# Patient Record
Sex: Female | Born: 1955 | Race: Black or African American | Hispanic: No | Marital: Married | Smoking: Former smoker
Health system: Southern US, Community
[De-identification: ages and names within clinical notes are randomized; demographics above are authoritative.]

## PROBLEM LIST (undated history)

## (undated) DIAGNOSIS — M1990 Unspecified osteoarthritis, unspecified site: Secondary | ICD-10-CM

## (undated) DIAGNOSIS — T7840XA Allergy, unspecified, initial encounter: Secondary | ICD-10-CM

## (undated) DIAGNOSIS — I10 Essential (primary) hypertension: Secondary | ICD-10-CM

## (undated) DIAGNOSIS — F329 Major depressive disorder, single episode, unspecified: Secondary | ICD-10-CM

## (undated) DIAGNOSIS — J45909 Unspecified asthma, uncomplicated: Secondary | ICD-10-CM

## (undated) DIAGNOSIS — E785 Hyperlipidemia, unspecified: Secondary | ICD-10-CM

## (undated) DIAGNOSIS — K319 Disease of stomach and duodenum, unspecified: Secondary | ICD-10-CM

## (undated) DIAGNOSIS — J189 Pneumonia, unspecified organism: Secondary | ICD-10-CM

## (undated) DIAGNOSIS — D649 Anemia, unspecified: Secondary | ICD-10-CM

## (undated) DIAGNOSIS — K219 Gastro-esophageal reflux disease without esophagitis: Secondary | ICD-10-CM

## (undated) DIAGNOSIS — F32A Depression, unspecified: Secondary | ICD-10-CM

## (undated) DIAGNOSIS — E119 Type 2 diabetes mellitus without complications: Secondary | ICD-10-CM

## (undated) HISTORY — DX: Gastro-esophageal reflux disease without esophagitis: K21.9

## (undated) HISTORY — DX: Allergy, unspecified, initial encounter: T78.40XA

## (undated) HISTORY — PX: EYE SURGERY: SHX253

## (undated) HISTORY — PX: TUBAL LIGATION: SHX77

## (undated) HISTORY — DX: Disease of stomach and duodenum, unspecified: K31.9

## (undated) HISTORY — DX: Hyperlipidemia, unspecified: E78.5

## (undated) HISTORY — DX: Essential (primary) hypertension: I10

## (undated) HISTORY — DX: Depression, unspecified: F32.A

## (undated) HISTORY — DX: Major depressive disorder, single episode, unspecified: F32.9

## (undated) HISTORY — PX: DILATION AND CURETTAGE OF UTERUS: SHX78

## (undated) HISTORY — PX: NASAL SINUS SURGERY: SHX719

## (undated) HISTORY — PX: TONSILLECTOMY: SUR1361

## (undated) HISTORY — PX: LAPAROSCOPIC OVARIAN CYSTECTOMY: SUR786

## (undated) HISTORY — DX: Type 2 diabetes mellitus without complications: E11.9

---

## 2005-02-06 ENCOUNTER — Emergency Department: Payer: Self-pay | Admitting: Emergency Medicine

## 2005-02-06 ENCOUNTER — Other Ambulatory Visit: Payer: Self-pay

## 2005-05-14 ENCOUNTER — Emergency Department: Payer: Self-pay | Admitting: Emergency Medicine

## 2005-06-13 ENCOUNTER — Other Ambulatory Visit: Payer: Self-pay

## 2005-06-13 ENCOUNTER — Emergency Department: Payer: Self-pay | Admitting: Emergency Medicine

## 2008-04-18 ENCOUNTER — Ambulatory Visit: Payer: Self-pay | Admitting: Family Medicine

## 2009-04-26 ENCOUNTER — Ambulatory Visit: Payer: Self-pay | Admitting: Family Medicine

## 2010-02-19 ENCOUNTER — Ambulatory Visit: Payer: Self-pay | Admitting: Internal Medicine

## 2010-03-05 ENCOUNTER — Ambulatory Visit: Payer: Self-pay | Admitting: Internal Medicine

## 2010-03-21 ENCOUNTER — Ambulatory Visit: Payer: Self-pay | Admitting: Internal Medicine

## 2010-04-21 ENCOUNTER — Ambulatory Visit: Payer: Self-pay | Admitting: Internal Medicine

## 2010-04-28 ENCOUNTER — Ambulatory Visit: Payer: Self-pay | Admitting: Internal Medicine

## 2010-05-30 ENCOUNTER — Ambulatory Visit: Payer: Self-pay | Admitting: Internal Medicine

## 2010-06-21 ENCOUNTER — Ambulatory Visit: Payer: Self-pay | Admitting: Internal Medicine

## 2010-07-22 ENCOUNTER — Ambulatory Visit: Payer: Self-pay | Admitting: Internal Medicine

## 2010-10-10 ENCOUNTER — Ambulatory Visit: Payer: Self-pay | Admitting: Internal Medicine

## 2010-10-22 ENCOUNTER — Ambulatory Visit: Payer: Self-pay | Admitting: Internal Medicine

## 2011-01-09 ENCOUNTER — Ambulatory Visit: Payer: Self-pay | Admitting: Internal Medicine

## 2011-01-20 ENCOUNTER — Ambulatory Visit: Payer: Self-pay | Admitting: Internal Medicine

## 2011-03-31 DIAGNOSIS — J3501 Chronic tonsillitis: Secondary | ICD-10-CM | POA: Insufficient documentation

## 2011-05-12 ENCOUNTER — Ambulatory Visit: Payer: Self-pay | Admitting: Internal Medicine

## 2011-05-14 ENCOUNTER — Ambulatory Visit: Payer: Self-pay | Admitting: Internal Medicine

## 2011-05-21 ENCOUNTER — Ambulatory Visit: Payer: Self-pay | Admitting: Unknown Physician Specialty

## 2011-05-23 ENCOUNTER — Ambulatory Visit: Payer: Self-pay | Admitting: Internal Medicine

## 2011-05-23 ENCOUNTER — Ambulatory Visit: Payer: Self-pay | Admitting: Unknown Physician Specialty

## 2011-06-22 ENCOUNTER — Ambulatory Visit: Payer: Self-pay | Admitting: Unknown Physician Specialty

## 2011-09-17 ENCOUNTER — Ambulatory Visit: Payer: Self-pay | Admitting: Internal Medicine

## 2011-09-22 ENCOUNTER — Ambulatory Visit: Payer: Self-pay | Admitting: Internal Medicine

## 2012-02-08 ENCOUNTER — Ambulatory Visit: Payer: Self-pay | Admitting: Internal Medicine

## 2012-02-08 LAB — CBC CANCER CENTER
Basophil %: 0.6 %
Eosinophil #: 0.6 x10 3/mm (ref 0.0–0.7)
HCT: 34.8 % — ABNORMAL LOW (ref 35.0–47.0)
HGB: 11.2 g/dL — ABNORMAL LOW (ref 12.0–16.0)
Lymphocyte #: 3.8 x10 3/mm — ABNORMAL HIGH (ref 1.0–3.6)
MCH: 27.5 pg (ref 26.0–34.0)
Monocyte #: 0.8 x10 3/mm (ref 0.2–0.9)
Monocyte %: 7.3 %
Platelet: 316 x10 3/mm (ref 150–440)
RBC: 4.06 10*6/uL (ref 3.80–5.20)

## 2012-02-08 LAB — IRON AND TIBC
Iron Saturation: 9 %
Iron: 39 ug/dL — ABNORMAL LOW (ref 50–170)
Unbound Iron-Bind.Cap.: 403 ug/dL

## 2012-02-08 LAB — FERRITIN: Ferritin (ARMC): 18 ng/mL (ref 8–388)

## 2012-02-11 LAB — OCCULT BLOOD X 1 CARD TO LAB, STOOL
Occult Blood, Feces: NEGATIVE
Occult Blood, Feces: NEGATIVE

## 2012-02-20 ENCOUNTER — Ambulatory Visit: Payer: Self-pay | Admitting: Internal Medicine

## 2012-04-15 ENCOUNTER — Ambulatory Visit: Payer: Self-pay | Admitting: Gastroenterology

## 2012-04-19 LAB — PATHOLOGY REPORT

## 2012-07-06 ENCOUNTER — Ambulatory Visit: Payer: Self-pay | Admitting: Family Medicine

## 2012-08-08 ENCOUNTER — Ambulatory Visit: Payer: Self-pay | Admitting: Internal Medicine

## 2012-08-08 LAB — CBC CANCER CENTER
Basophil #: 0.1 x10 3/mm (ref 0.0–0.1)
Basophil %: 0.8 %
Eosinophil #: 0.5 x10 3/mm (ref 0.0–0.7)
HCT: 34.9 % — ABNORMAL LOW (ref 35.0–47.0)
HGB: 10.9 g/dL — ABNORMAL LOW (ref 12.0–16.0)
Lymphocyte #: 3.6 x10 3/mm (ref 1.0–3.6)
Lymphocyte %: 36.7 %
MCHC: 31.2 g/dL — ABNORMAL LOW (ref 32.0–36.0)
Neutrophil #: 5.2 x10 3/mm (ref 1.4–6.5)
RBC: 4.11 10*6/uL (ref 3.80–5.20)
RDW: 13.7 % (ref 11.5–14.5)
WBC: 9.9 x10 3/mm (ref 3.6–11.0)

## 2012-08-21 ENCOUNTER — Ambulatory Visit: Payer: Self-pay | Admitting: Internal Medicine

## 2012-11-18 DIAGNOSIS — J329 Chronic sinusitis, unspecified: Secondary | ICD-10-CM | POA: Insufficient documentation

## 2012-12-07 DIAGNOSIS — J320 Chronic maxillary sinusitis: Secondary | ICD-10-CM | POA: Insufficient documentation

## 2013-02-07 ENCOUNTER — Ambulatory Visit: Payer: Self-pay | Admitting: Internal Medicine

## 2013-07-02 DIAGNOSIS — E782 Mixed hyperlipidemia: Secondary | ICD-10-CM | POA: Insufficient documentation

## 2013-07-07 ENCOUNTER — Ambulatory Visit: Payer: Self-pay | Admitting: Family Medicine

## 2013-09-10 ENCOUNTER — Emergency Department: Payer: Self-pay | Admitting: Emergency Medicine

## 2013-09-10 LAB — BASIC METABOLIC PANEL
Anion Gap: 6 — ABNORMAL LOW (ref 7–16)
BUN: 11 mg/dL (ref 7–18)
Chloride: 110 mmol/L — ABNORMAL HIGH (ref 98–107)
Co2: 25 mmol/L (ref 21–32)
Creatinine: 0.75 mg/dL (ref 0.60–1.30)
EGFR (African American): >60
Potassium: 3.8 mmol/L (ref 3.5–5.1)
Sodium: 141 mmol/L (ref 136–145)

## 2013-09-10 LAB — CBC
HCT: 32 % — ABNORMAL LOW (ref 35.0–47.0)
MCH: 25.6 pg — ABNORMAL LOW (ref 26.0–34.0)
MCV: 82 fL (ref 80–100)
RBC: 3.9 10*6/uL (ref 3.80–5.20)

## 2014-08-07 DIAGNOSIS — M542 Cervicalgia: Secondary | ICD-10-CM | POA: Insufficient documentation

## 2014-08-21 ENCOUNTER — Ambulatory Visit: Payer: Self-pay | Admitting: Internal Medicine

## 2014-10-31 ENCOUNTER — Ambulatory Visit (INDEPENDENT_AMBULATORY_CARE_PROVIDER_SITE_OTHER): Admitting: Cardiovascular Disease

## 2014-10-31 ENCOUNTER — Encounter: Payer: Self-pay | Admitting: Cardiovascular Disease

## 2014-10-31 ENCOUNTER — Encounter (INDEPENDENT_AMBULATORY_CARE_PROVIDER_SITE_OTHER): Payer: Self-pay

## 2014-10-31 VITALS — BP 120/72 | HR 84 | Ht 65.0 in | Wt 171.5 lb

## 2014-10-31 DIAGNOSIS — I10 Essential (primary) hypertension: Secondary | ICD-10-CM

## 2014-10-31 DIAGNOSIS — E669 Obesity, unspecified: Secondary | ICD-10-CM

## 2014-10-31 DIAGNOSIS — E785 Hyperlipidemia, unspecified: Secondary | ICD-10-CM | POA: Insufficient documentation

## 2014-10-31 DIAGNOSIS — R079 Chest pain, unspecified: Secondary | ICD-10-CM

## 2014-10-31 DIAGNOSIS — R9431 Abnormal electrocardiogram [ECG] [EKG]: Secondary | ICD-10-CM

## 2014-10-31 DIAGNOSIS — E663 Overweight: Secondary | ICD-10-CM | POA: Insufficient documentation

## 2014-10-31 MED ORDER — CYCLOBENZAPRINE HCL 10 MG PO TABS: 10.0000 mg | ORAL_TABLET | Freq: Three times a day (TID) | ORAL | Status: DC | PRN

## 2014-10-31 NOTE — Progress Notes (Signed)
Patient ID: Megan Mooney, female    DOB: 03/02/1956, 59 y.o.   MRN: [id number]  HPI Comments: Megan Mooney is a 59 year old woman with history of type 2 diabetes, hemoglobin A1c 7.2, mild obesity, hypertension, hyperlipidemia who presents by referral for evaluation of abnormal EKG and chest pain.  She reports that over the past several days she has had right-sided chest pain underneath her right breast. Symptoms started over the weekend. She denies any heavy lifting or any trauma. Symptoms are worse when she stands up, also bad when she bends over, also a sharp pain when she breathes in deep. Stabbing pain goes from the right side under her breast inside, sometimes to her back with a deep breath. She denies any cough or congestion. She does notice the symptoms sometimes in bed with certain positions. Denies having any worsening symptoms with exertion such as walking. Typically she is very active with no complaints. She is trying to do better with her diet to get her hemoglobin A1c less than 7  EKG on today's visit in the office shows normal sinus rhythm with rate 84 bpm, no significant ST or T-wave changes EKG through primary care is essentially the same as last a day. There was a question of nonspecific T wave abnormality. This was not really seen on any of the EKGs from primary care every 10th and again not seen today   Allergies  Allergen Reactions  . Advil [Ibuprofen]     Shortness of breath.    Outpatient Encounter Prescriptions as of 10/31/2014  Medication Sig  . aspirin 81 MG tablet Take 81 mg by mouth daily.  . canagliflozin (INVOKANA) 100 MG TABS tablet Take 100 mg by mouth daily.  . cetirizine (ZYRTEC) 10 MG tablet Take 10 mg by mouth daily.  . cyclobenzaprine (FLEXERIL) 10 MG tablet Take 1 tablet (10 mg total) by mouth 3 (three) times daily as needed for muscle spasms.  . hydrocortisone cream 1 % Apply 1 application topically as needed.   . insulin aspart (NOVOLOG) 100 UNIT/ML  FlexPen Inject 15 Units into the skin daily.  . Insulin Detemir (LEVEMIR) 100 UNIT/ML Pen Inject 80 Units into the skin daily.  Marland Kitchen. lisinopril (PRINIVIL,ZESTRIL) 20 MG tablet Take 20 mg by mouth daily.  . metFORMIN (GLUMETZA) 1000 MG (MOD) 24 hr tablet Take one tablet in the am with half tablet in the pm.  . naproxen sodium (ANAPROX) 220 MG tablet Take 220 mg by mouth 2 (two) times daily with a meal.  . nitroGLYCERIN (NITROSTAT) 0.4 MG SL tablet Place 0.4 mg under the tongue every 5 (five) minutes as needed for chest pain.  Marland Kitchen. omeprazole (PRILOSEC) 20 MG capsule Take 20 mg by mouth daily.  . simvastatin (ZOCOR) 20 MG tablet Take 20 mg by mouth daily.    Past Medical History  Diagnosis Date  . Diabetes mellitus without complication   . Hypertension   . Hyperlipidemia   . Depression     Past Surgical History  Procedure Laterality Date  . Tubal ligation    . Tonsillectomy    . Laparoscopic ovarian cystectomy    . Nasal sinus surgery      Social History  reports that she quit smoking about 28 years ago. Her smoking use included Cigarettes. She has a 2.5 pack-year smoking history. She does not have any smokeless tobacco history on file. She reports that she does not drink alcohol or use illicit drugs.  Family History family history includes Hyperlipidemia in her  brother, father, and mother; Hypertension in her brother, father, and mother.  Review of Systems  Constitutional: Negative.   Respiratory: Positive for chest tightness.   Cardiovascular: Negative.   Gastrointestinal: Negative.   Musculoskeletal: Negative.   Skin: Negative.   Neurological: Negative.   Hematological: Negative.   Psychiatric/Behavioral: Negative.   All other systems reviewed and are negative.   BP 120/72 mmHg  Pulse 84  Ht  (1.651 m)  Wt 171 lb 8 oz (77.792 kg)  BMI 28.54 kg/m2   Physical Exam  Constitutional: She is oriented to person, place, and time. She appears well-developed and  well-nourished.  HENT:  Head: Normocephalic.  Nose: Nose normal.  Mouth/Throat: Oropharynx is clear and moist.  Eyes: Conjunctivae are normal. Pupils are equal, round, and reactive to light.  Neck: Normal range of motion. Neck supple. No JVD present.  Cardiovascular: Normal rate, regular rhythm, S1 normal, S2 normal, normal heart sounds and intact distal pulses.  Exam reveals no gallop and no friction rub.   No murmur heard. Pulmonary/Chest: Effort normal and breath sounds normal. No respiratory distress. She has no wheezes. She has no rales. She exhibits no tenderness.  Abdominal: Soft. Bowel sounds are normal. She exhibits no distension. There is no tenderness.  Musculoskeletal: Normal range of motion. She exhibits no edema or tenderness.  Lymphadenopathy:    She has no cervical adenopathy.  Neurological: She is alert and oriented to person, place, and time. Coordination normal.  Skin: Skin is warm and dry. No rash noted. No erythema.  Psychiatric: She has a normal mood and affect. Her behavior is normal. Judgment and thought content normal.    Assessment and Plan  Nursing note and vitals reviewed.

## 2014-10-31 NOTE — Assessment & Plan Note (Signed)
Essentially normal EKG on today's visit. No further testing needed

## 2014-10-31 NOTE — Assessment & Plan Note (Signed)
We have encouraged continued exercise, careful diet management in an effort to lose weight. 

## 2014-10-31 NOTE — Assessment & Plan Note (Signed)
Encourage her to stay on her simvastatin. Goal LDL less than 100 as she is a diabetic

## 2014-10-31 NOTE — Assessment & Plan Note (Signed)
Blood pressure is well controlled on today's visit. No changes made to the medications. 

## 2014-10-31 NOTE — Patient Instructions (Signed)
You are doing well.  You likely have a musculoligamental strain Please take aleve 1 to 2 pills twice a day as needed  Take with flexeril up to three times a day as needed, Cut in 1/2 to start, can cause fatigue  Please call us if you have new issues that need to be addressed before your next appt.

## 2014-10-31 NOTE — Assessment & Plan Note (Signed)
Musculoskeletal chest pain, reproducible with deep breath, bending over, standing. Suggested she try NSAIDs. Prescription for Flexeril provided.

## 2015-03-07 ENCOUNTER — Encounter: Payer: Self-pay | Admitting: Family Medicine

## 2015-03-07 ENCOUNTER — Ambulatory Visit (INDEPENDENT_AMBULATORY_CARE_PROVIDER_SITE_OTHER): Admitting: Family Medicine

## 2015-03-07 ENCOUNTER — Ambulatory Visit
Admission: RE | Admit: 2015-03-07 | Discharge: 2015-03-07 | Disposition: A | Discharge status: Home / Self Care | Source: Ambulatory Visit | Attending: Family Medicine | Admitting: Family Medicine

## 2015-03-07 VITALS — BP 138/78 | HR 71 | Temp 98.3°F | Resp 16 | Ht 64.0 in | Wt 163.8 lb

## 2015-03-07 DIAGNOSIS — R05 Cough: Secondary | ICD-10-CM | POA: Insufficient documentation

## 2015-03-07 DIAGNOSIS — Z87891 Personal history of nicotine dependence: Secondary | ICD-10-CM | POA: Diagnosis not present

## 2015-03-07 DIAGNOSIS — R0602 Shortness of breath: Secondary | ICD-10-CM | POA: Diagnosis not present

## 2015-03-07 DIAGNOSIS — D509 Iron deficiency anemia, unspecified: Secondary | ICD-10-CM | POA: Insufficient documentation

## 2015-03-07 DIAGNOSIS — R052 Subacute cough: Secondary | ICD-10-CM

## 2015-03-07 DIAGNOSIS — I10 Essential (primary) hypertension: Secondary | ICD-10-CM

## 2015-03-07 DIAGNOSIS — J4520 Mild intermittent asthma, uncomplicated: Secondary | ICD-10-CM | POA: Insufficient documentation

## 2015-03-07 DIAGNOSIS — K219 Gastro-esophageal reflux disease without esophagitis: Secondary | ICD-10-CM | POA: Insufficient documentation

## 2015-03-07 DIAGNOSIS — J309 Allergic rhinitis, unspecified: Secondary | ICD-10-CM | POA: Insufficient documentation

## 2015-03-07 MED ORDER — AZITHROMYCIN 250 MG PO TABS: ORAL_TABLET | ORAL | Status: AC

## 2015-03-07 MED ORDER — BENZONATATE 100 MG PO CAPS: 100.0000 mg | ORAL_CAPSULE | Freq: Three times a day (TID) | ORAL | Status: DC | PRN

## 2015-03-07 MED ORDER — PROAIR HFA 108 (90 BASE) MCG/ACT IN AERS: INHALATION_SPRAY | RESPIRATORY_TRACT | Status: DC

## 2015-03-07 MED ORDER — LOSARTAN POTASSIUM 100 MG PO TABS: 100.0000 mg | ORAL_TABLET | Freq: Every day | ORAL | Status: DC

## 2015-03-07 NOTE — Patient Instructions (Signed)
Cough: Tessalon perles to help with cough. Antibiotic for 5 days.  I have renewed your inhaler and changed your BP medication.  Ideally these will help with cough.  We will get breathing tests if cough doesn't get better.   Please seek immediate medical attention if you develop shortness of breath not relieve by inhaler, chest pain/tightness, fever > 103 F or other concerning symptoms.

## 2015-03-07 NOTE — Assessment & Plan Note (Signed)
Chest XR clear today. Treat for bronchitis given lingering symptoms after viral infection. Offered pt prednisone, pt would like to try as last resort. She will  Call in 2 weeks if symptoms do not improve. Change ACE to ARB for cough.  Renewed inhaler.  Spirometry will be done to assess asthma control.

## 2015-03-07 NOTE — Assessment & Plan Note (Signed)
BP has not been well controlled at home. Change ACE to ARB for cough. Increase dose. Pt will continue to check BP at home. Dash diet reviewed. RTC 1 month.

## 2015-03-07 NOTE — Progress Notes (Signed)
Subjective:    Patient ID: Megan Mooney, female    DOB: 1956/05/01, 59 y.o.   MRN: [id number]  HPI: Megan Mooney is a 59 y.o. female presenting on 03/07/2015 for Cough and Hypertension   Cough This is a recurrent problem. The current episode started more than 1 month ago. The problem has been unchanged. The problem occurs hourly. The cough is productive of purulent sputum. Associated symptoms include rhinorrhea and shortness of breath (with coughing.). Pertinent negatives include no chest pain, chills, fever, headaches, myalgias, nasal congestion, rash, sore throat or wheezing. The symptoms are aggravated by lying down and exercise. She has tried a beta-agonist inhaler and OTC cough suppressant for the symptoms. The treatment provided mild relief. Her past medical history is significant for asthma (pt wasn't sure if it was truly asthma. Was placed on inhaler due to cough).  Cough present since the beginning of May. Was thought to be PND- did not improve with flonase, inhaler, and OTC cough suppressant. Pt went to [place] and returned on 5/29 and was diagnosed with the flu. Cough symptoms remain- clear sputum production, shortness of breath with exertion. Denies chest tightness but describes it as "feeling gurgling."  Pt is taking an ACE inhibitor- about 1 year.    HTN: BP is elevated in the office. Pt has also been checking at home and continues to be elevated. 093/23-55. No CP, visual changes, or HA.   Past Medical History  Diagnosis Date  . Diabetes mellitus without complication   . Hypertension   . Hyperlipidemia   . Depression     Current Outpatient Prescriptions on File Prior to Visit  Medication Sig  . aspirin 81 MG tablet Take 81 mg by mouth daily.  . cetirizine (ZYRTEC) 10 MG tablet Take 10 mg by mouth daily.  . hydrocortisone cream 1 % Apply 1 application topically as needed.   . insulin aspart (NOVOLOG) 100 UNIT/ML FlexPen Inject 15 Units into the skin daily.  . Insulin Detemir  (LEVEMIR) 100 UNIT/ML Pen Inject 80 Units into the skin daily.  . metFORMIN (GLUMETZA) 1000 MG (MOD) 24 hr tablet Take one tablet in the am with half tablet in the pm.  . naproxen sodium (ANAPROX) 220 MG tablet Take 220 mg by mouth 2 (two) times daily with a meal.  . omeprazole (PRILOSEC) 20 MG capsule Take 20 mg by mouth daily.  . simvastatin (ZOCOR) 20 MG tablet Take 20 mg by mouth daily.  . canagliflozin (INVOKANA) 100 MG TABS tablet Take 100 mg by mouth daily.  . cyclobenzaprine (FLEXERIL) 10 MG tablet Take 1 tablet (10 mg total) by mouth 3 (three) times daily as needed for muscle spasms. (Patient not taking: Reported on 03/07/2015)  . nitroGLYCERIN (NITROSTAT) 0.4 MG SL tablet Place 0.4 mg under the tongue every 5 (five) minutes as needed for chest pain.   No current facility-administered medications on file prior to visit.    Review of Systems  Constitutional: Negative for fever and chills.  HENT: Positive for rhinorrhea. Negative for sinus pressure and sore throat.   Respiratory: Positive for cough, chest tightness and shortness of breath (with coughing.). Negative for wheezing.   Cardiovascular: Negative for chest pain.  Genitourinary: Negative.   Musculoskeletal: Negative for myalgias, back pain, arthralgias, neck pain and neck stiffness.  Skin: Negative for rash and wound.  Neurological: Negative for light-headedness, numbness and headaches.  Psychiatric/Behavioral: Negative.    Per HPI unless specifically indicated above     Objective:  BP 138/78 mmHg  Pulse 71  Temp(Src) 98.3 F (36.8 C) (Oral)  Resp 16  Ht $R'5\' 4"'Gp$  (1.626 m)  Wt 163 lb 12.8 oz (74.299 kg)  BMI 28.10 kg/m2  SpO2 99%  LMP   Wt Readings from Last 3 Encounters:  03/07/15 163 lb 12.8 oz (74.299 kg)  10/31/14 171 lb 8 oz (77.792 kg)    Physical Exam  Constitutional: No distress.  HENT:  Right Ear: Hearing and tympanic membrane normal.  Left Ear: Hearing and tympanic membrane normal.  Nose: Mucosal  edema present. No rhinorrhea.  Mouth/Throat: Oropharynx is clear and moist and mucous membranes are normal. No oropharyngeal exudate, posterior oropharyngeal edema or posterior oropharyngeal erythema.  Neck: Normal range of motion. Neck supple.  Cardiovascular: Normal rate, regular rhythm, S1 normal and S2 normal.  Exam reveals no gallop and no friction rub.   No murmur heard. Pulmonary/Chest: No accessory muscle usage. No respiratory distress. She has decreased breath sounds in the right lower field and the left lower field. She has wheezes (with coughing.). She has no rhonchi. She has no rales. Chest wall is not dull to percussion. She exhibits no tenderness.  Musculoskeletal: Normal range of motion. She exhibits no edema or tenderness.  Lymphadenopathy:    She has no cervical adenopathy.  Skin: Skin is warm. No rash noted. She is not diaphoretic. No erythema.   Results for orders placed or performed in visit on 17/49/44  Basic metabolic panel  Result Value Ref Range   Glucose 86 65-99 mg/dL   BUN 11 7-18 mg/dL   Creatinine 0.75 0.60-1.30 mg/dL   Sodium 141 136-145 mmol/L   Potassium 3.8 3.5-5.1 mmol/L   Chloride 110 (H) 98-107 mmol/L   Co2 25 21-32 mmol/L   Calcium, Total 9.2 8.5-10.1 mg/dL   Osmolality 280 275-301   Anion Gap 6 (L) 7-16   EGFR (African American) >60    EGFR (Non-African Amer.) >60   CBC  Result Value Ref Range   WBC 11.6 (H) 3.6-11.0 x10 3/mm 3   RBC 3.90 3.80-5.20 X10 6/mm 3   HGB 10.0 (L) 12.0-16.0 g/dL   HCT 32.0 (L) 35.0-47.0 %   MCV 82 80-100 fL   MCH 25.6 (L) 26.0-34.0 pg   MCHC 31.2 (L) 32.0-36.0 g/dL   RDW 14.0 11.5-14.5 %   Platelet 291 150-440 x10 3/mm 3      Assessment & Plan:   Problem List Items Addressed This Visit      Cardiovascular and Mediastinum   Essential hypertension    BP has not been well controlled at home. Change ACE to ARB for cough. Increase dose. Pt will continue to check BP at home. Dash diet reviewed. RTC 1 month.        Relevant Medications   losartan (COZAAR) 100 MG tablet     Other   Subacute cough - Primary    Chest XR clear today. Treat for bronchitis given lingering symptoms after viral infection. Offered pt prednisone, pt would like to try as last resort. She will  Call in 2 weeks if symptoms do not improve. Change ACE to ARB for cough.  Renewed inhaler.  Spirometry will be done to assess asthma control.       Relevant Medications   PROAIR HFA 108 (90 BASE) MCG/ACT inhaler   benzonatate (TESSALON) 100 MG capsule   azithromycin (ZITHROMAX) 250 MG tablet   Other Relevant Orders   DG Chest 2 View (Completed)      Meds ordered  this encounter  Medications  . ACCU-CHEK FASTCLIX LANCETS MISC    Sig:   . empagliflozin (JARDIANCE) 25 MG TABS tablet    Sig: Take 25 mg by mouth.  . fluticasone (FLONASE) 50 MCG/ACT nasal spray    Sig: Place 1 spray into both nostrils every morning.  . Multiple Vitamin (MULTIVITAMIN) capsule    Sig: Take by mouth.  . DISCONTD: PROAIR HFA 108 (90 BASE) MCG/ACT inhaler    Sig: INHALE 2 PUFFS 4 TIMES DAILY AS NEEDED    Refill:  0  . Dextromethorphan-Guaifenesin (MUCINEX DM) 30-600 MG TB12    Sig: Take 1 tablet by mouth 2 (two) times daily.    Refill:  0  . losartan (COZAAR) 100 MG tablet    Sig: Take 1 tablet (100 mg total) by mouth daily.    Dispense:  90 tablet    Refill:  3    Order Specific Question:  Supervising Provider    Answer:  Arlis Porta [phone]  . PROAIR HFA 108 (90 BASE) MCG/ACT inhaler    Sig: INHALE 2 PUFFS 4 TIMES DAILY AS NEEDED    Dispense:  1 Inhaler    Refill:  11    Order Specific Question:  Supervising Provider    Answer:  Arlis Porta [phone]  . benzonatate (TESSALON) 100 MG capsule    Sig: Take 1 capsule (100 mg total) by mouth 3 (three) times daily as needed for cough.    Dispense:  30 capsule    Refill:  1    Order Specific Question:  Supervising Provider    Answer:  Arlis Porta [phone]  .  azithromycin (ZITHROMAX) 250 MG tablet    Sig: Dispense as Zpak- Take 2 pills today. Then day 1 pill daily for days 2-5    Dispense:  6 tablet    Refill:  0    Order Specific Question:  Supervising Provider    Answer:  Arlis Porta [[id number]]      Follow up plan: Return in about 4 weeks (around 04/04/2015).

## 2015-03-29 ENCOUNTER — Other Ambulatory Visit: Payer: Self-pay | Admitting: Family Medicine

## 2015-04-09 ENCOUNTER — Encounter: Payer: Self-pay | Admitting: Family Medicine

## 2015-04-09 ENCOUNTER — Ambulatory Visit (INDEPENDENT_AMBULATORY_CARE_PROVIDER_SITE_OTHER): Admitting: Family Medicine

## 2015-04-09 VITALS — BP 146/91 | HR 73 | Temp 98.1°F | Resp 16 | Ht 64.0 in | Wt 164.2 lb

## 2015-04-09 DIAGNOSIS — I10 Essential (primary) hypertension: Secondary | ICD-10-CM

## 2015-04-09 DIAGNOSIS — K219 Gastro-esophageal reflux disease without esophagitis: Secondary | ICD-10-CM | POA: Diagnosis not present

## 2015-04-09 MED ORDER — OMEPRAZOLE 20 MG PO CPDR: 20.0000 mg | DELAYED_RELEASE_CAPSULE | Freq: Every day | ORAL | Status: DC

## 2015-04-09 MED ORDER — LOSARTAN POTASSIUM-HCTZ 100-12.5 MG PO TABS: 1.0000 | ORAL_TABLET | Freq: Every day | ORAL | Status: DC

## 2015-04-09 NOTE — Patient Instructions (Addendum)
Your goal blood pressure is 140/90. Work on low salt/sodium diet - goal <2.4gm (2,400mg) per day. Eat a diet high in fruits/vegetables and whole grains.  Look into mediterranean and DASH diet. Goal activity is 150min/wk of moderate intensity exercise.  This can be split into 30 minute chunks.  If you are not at this level, you can start with smaller 10-15 min increments and slowly build up activity. Look at www.heart.org for more resources  Please seek immediate medical attention at ER or Urgent Care if you develop: Chest pain, pressure or tightness. Shortness of breath accompanied by nausea or diaphoresis Visual changes Numbness or tingling on one side of the body Facial droop Altered mental status Or any concerning symptoms.  

## 2015-04-09 NOTE — Assessment & Plan Note (Signed)
Continue omeprazole. Alarm symptoms reviewed.

## 2015-04-09 NOTE — Progress Notes (Signed)
Subjective:    Patient ID: Megan Mooney, female    DOB: May 15, 1956, 59 y.o.   MRN: [id number]  HPI: Megan Mooney is a 59 y.o. female presenting on 04/09/2015 for Hypertension and Gastrophageal Reflux   Hypertension This is a chronic problem. The current episode started more than 1 year ago. The problem has been gradually worsening since onset. Associated symptoms include headaches. Pertinent negatives include no anxiety, blurred vision, chest pain, neck pain, palpitations, peripheral edema, shortness of breath or sweats. Risk factors for coronary artery disease include diabetes mellitus and dyslipidemia.  Gastrophageal Reflux She complains of heartburn (occasional). She reports no abdominal pain, no chest pain, no choking, no dysphagia, no early satiety, no sore throat or no wheezing. This is a chronic problem. The current episode started more than 1 year ago. The problem has been unchanged. The heartburn is of mild intensity. The heartburn does not wake her from sleep. The heartburn does not limit her activity. The heartburn doesn't change with position. The symptoms are aggravated by certain foods. Pertinent negatives include no fatigue, melena, muscle weakness or weight loss. She has tried a PPI for the symptoms. The treatment provided significant relief.    Pt presents to follow-up blood pressure today. Her BP was low in January of this year and her diuretic was removed. Pt has since noticed her BP is creeping up. Avg 140's/90's at home.  Highest 150/97 at home. She was innovokana for diabetes but was switched off recently and her pressures have increased since then.  Reporting occasional HA. She is due for eye exam this fall at the [place].   Past Medical History  Diagnosis Date  . Diabetes mellitus without complication   . Hypertension   . Hyperlipidemia   . Depression     Current Outpatient Prescriptions on File Prior to Visit  Medication Sig  . ACCU-CHEK FASTCLIX LANCETS MISC   . aspirin  81 MG tablet Take 81 mg by mouth daily.  . benzonatate (TESSALON) 100 MG capsule Take 1 capsule (100 mg total) by mouth 3 (three) times daily as needed for cough.  . canagliflozin (INVOKANA) 100 MG TABS tablet Take 100 mg by mouth daily.  . cetirizine (ZYRTEC) 10 MG tablet TAKE 1 TABLET DAILY  . cyclobenzaprine (FLEXERIL) 10 MG tablet Take 1 tablet (10 mg total) by mouth 3 (three) times daily as needed for muscle spasms.  . Dextromethorphan-Guaifenesin (MUCINEX DM) 30-600 MG TB12 Take 1 tablet by mouth 2 (two) times daily.  . empagliflozin (JARDIANCE) 25 MG TABS tablet Take 25 mg by mouth.  . fluticasone (FLONASE) 50 MCG/ACT nasal spray Place 1 spray into both nostrils every morning.  . hydrocortisone cream 1 % Apply 1 application topically as needed.   . insulin aspart (NOVOLOG) 100 UNIT/ML FlexPen Inject 15 Units into the skin daily.  . Insulin Detemir (LEVEMIR) 100 UNIT/ML Pen Inject 80 Units into the skin daily.  Marland Kitchen losartan (COZAAR) 100 MG tablet Take 1 tablet (100 mg total) by mouth daily.  . metFORMIN (GLUMETZA) 1000 MG (MOD) 24 hr tablet Take one tablet in the am with half tablet in the pm.  . Multiple Vitamin (MULTIVITAMIN) capsule Take by mouth.  . naproxen sodium (ANAPROX) 220 MG tablet Take 220 mg by mouth 2 (two) times daily with a meal.  . nitroGLYCERIN (NITROSTAT) 0.4 MG SL tablet Place 0.4 mg under the tongue every 5 (five) minutes as needed for chest pain.  Marland Kitchen PROAIR HFA 108 (90 BASE) MCG/ACT inhaler INHALE 2  PUFFS 4 TIMES DAILY AS NEEDED  . simvastatin (ZOCOR) 20 MG tablet Take 20 mg by mouth daily.   No current facility-administered medications on file prior to visit.    Review of Systems  Constitutional: Negative for fever, chills, weight loss and fatigue.  HENT: Negative for sore throat.   Eyes: Negative for blurred vision.  Respiratory: Negative for choking, shortness of breath and wheezing.   Cardiovascular: Negative for chest pain, palpitations and leg swelling.   Gastrointestinal: Positive for heartburn (occasional). Negative for dysphagia, abdominal pain, melena and abdominal distention.  Endocrine: Positive for heat intolerance (hot flashes). Negative for cold intolerance, polydipsia, polyphagia and polyuria.  Genitourinary: Negative.   Musculoskeletal: Negative.  Negative for muscle weakness and neck pain.  Neurological: Positive for headaches. Negative for dizziness and numbness.  Psychiatric/Behavioral: Negative.    Per HPI unless specifically indicated above     Objective:    BP 146/91 mmHg  Pulse 73  Temp(Src) 98.1 F (36.7 C) (Oral)  Resp 16  Ht _0  (1.626 m)  Wt 164 lb 3.2 oz (74.481 kg)  BMI 28.17 kg/m2  Wt Readings from Last 3 Encounters:  04/09/15 164 lb 3.2 oz (74.481 kg)  03/07/15 163 lb 12.8 oz (74.299 kg)  10/31/14 171 lb 8 oz (77.792 kg)    Physical Exam  Constitutional: She is oriented to person, place, and time. She appears well-developed and well-nourished. No distress.  Neck: Normal range of motion. Neck supple. No thyromegaly present.  Cardiovascular: Normal rate and regular rhythm.  Exam reveals no gallop and no friction rub.   No murmur heard. Pulmonary/Chest: Effort normal and breath sounds normal.  Abdominal: Soft. Bowel sounds are normal. There is no tenderness. There is no rebound.  Musculoskeletal: Normal range of motion. She exhibits no edema or tenderness.  Lymphadenopathy:    She has no cervical adenopathy.  Neurological: She is alert and oriented to person, place, and time.  Skin: Skin is warm and dry. She is not diaphoretic.   Results for orders placed or performed in visit on 37/62/83  Basic metabolic panel  Result Value Ref Range   Glucose 86 65-99 mg/dL   BUN 11 7-18 mg/dL   Creatinine 0.75 0.60-1.30 mg/dL   Sodium 141 136-145 mmol/L   Potassium 3.8 3.5-5.1 mmol/L   Chloride 110 (H) 98-107 mmol/L   Co2 25 21-32 mmol/L   Calcium, Total 9.2 8.5-10.1 mg/dL   Osmolality 280 275-301   Anion  Gap 6 (L) 7-16   EGFR (African American) >60    EGFR (Non-African Amer.) >60   CBC  Result Value Ref Range   WBC 11.6 (H) 3.6-11.0 x10 3/mm 3   RBC 3.90 3.80-5.20 X10 6/mm 3   HGB 10.0 (L) 12.0-16.0 g/dL   HCT 32.0 (L) 35.0-47.0 %   MCV 82 80-100 fL   MCH 25.6 (L) 26.0-34.0 pg   MCHC 31.2 (L) 32.0-36.0 g/dL   RDW 14.0 11.5-14.5 %   Platelet 291 150-440 x10 3/mm 3      Assessment & Plan:   Problem List Items Addressed This Visit      Cardiovascular and Mediastinum   Essential hypertension - Primary    Restart HCTZ and cozaar combination due to elevation in blood pressure. Likely due to discontinuation of Invokana which dropped pt's blood pressure.  DASH diet reviewed. Pt encouraged to exercise daily. RTC 3-15mo.       Relevant Medications   losartan-hydrochlorothiazide (HYZAAR) 100-12.5 MG per tablet     Digestive  GERD without esophagitis   Relevant Medications   omeprazole (PRILOSEC) 20 MG capsule      Meds ordered this encounter  Medications  . losartan-hydrochlorothiazide (HYZAAR) 100-12.5 MG per tablet    Sig: Take 1 tablet by mouth daily.    Dispense:  90 tablet    Refill:  3    Order Specific Question:  Supervising Provider    Answer:  Arlis Porta [phone]  . omeprazole (PRILOSEC) 20 MG capsule    Sig: Take 1 capsule (20 mg total) by mouth daily.    Dispense:  90 capsule    Refill:  3    Order Specific Question:  Supervising Provider    Answer:  Arlis Porta [[id number]]      Follow up plan: Return in about 4 months (around 08/10/2015).

## 2015-04-09 NOTE — Assessment & Plan Note (Signed)
Restart HCTZ and cozaar combination due to elevation in blood pressure. Likely due to discontinuation of Invokana which dropped pt's blood pressure.  DASH diet reviewed. Pt encouraged to exercise daily. RTC 3-424mos.

## 2015-05-23 LAB — HM DIABETES EYE EXAM

## 2015-06-27 ENCOUNTER — Encounter: Payer: Self-pay | Admitting: Family Medicine

## 2015-06-27 ENCOUNTER — Ambulatory Visit (INDEPENDENT_AMBULATORY_CARE_PROVIDER_SITE_OTHER): Admitting: Family Medicine

## 2015-06-27 VITALS — BP 128/83 | HR 103 | Temp 99.1°F | Resp 16 | Ht 63.0 in | Wt 162.8 lb

## 2015-06-27 DIAGNOSIS — L659 Nonscarring hair loss, unspecified: Secondary | ICD-10-CM

## 2015-06-27 DIAGNOSIS — I10 Essential (primary) hypertension: Secondary | ICD-10-CM | POA: Diagnosis not present

## 2015-06-27 DIAGNOSIS — E119 Type 2 diabetes mellitus without complications: Secondary | ICD-10-CM

## 2015-06-27 DIAGNOSIS — Z794 Long term (current) use of insulin: Secondary | ICD-10-CM

## 2015-06-27 DIAGNOSIS — K219 Gastro-esophageal reflux disease without esophagitis: Secondary | ICD-10-CM | POA: Diagnosis not present

## 2015-06-27 DIAGNOSIS — E11311 Type 2 diabetes mellitus with unspecified diabetic retinopathy with macular edema: Secondary | ICD-10-CM | POA: Insufficient documentation

## 2015-06-27 NOTE — Progress Notes (Signed)
Date:  06/27/2015   Name:  Megan Mooney   DOB:  [birth year]   MRN:  [id number]  PCP:  Filbert Berthold, NP    Chief Complaint: Alopecia   History of Present Illness:  This is a 59 y.o. female with progressive alopecia over several years, 3 sisters with similar sxs, one saw derm and received cream that helped. Hx T2DM last a1c 7.2% followed by [facility] endo. GERD well controlled on chronic PPI. BP better back on meds. Had vit D/B12/TSH checked in July. Has f/u appt with NP in November.  Review of Systems:  Review of Systems  Respiratory: Negative for shortness of breath.   Cardiovascular: Negative for chest pain and leg swelling.  Skin: Negative for rash.    Patient Active Problem List   Diagnosis Date Noted  . Controlled type 2 diabetes mellitus without complication (HCC) 06/27/2015  . Alopecia 06/27/2015  . Allergic rhinitis 03/07/2015  . GERD without esophagitis 03/07/2015  . Anemia, iron deficiency 03/07/2015  . Asthma, intermittent 03/07/2015  . Obesity 10/31/2014  . Hyperlipidemia 10/31/2014  . Essential hypertension 10/31/2014  . Cervical pain 08/07/2014  . Antritis chronic 12/07/2012  . Chronic infection of sinus 11/18/2012  . Chronic tonsillitis 03/31/2011    Prior to Admission medications   Medication Sig Start Date End Date Taking? Authorizing Provider  ACCU-CHEK FASTCLIX LANCETS MISC  08/02/14  Yes Historical Provider, MD  aspirin 81 MG tablet Take 81 mg by mouth daily.   Yes Historical Provider, MD  cetirizine (ZYRTEC) 10 MG tablet TAKE 1 TABLET DAILY 03/29/15  Yes Amy Rusty Aus, NP  empagliflozin (JARDIANCE) 25 MG TABS tablet Take 25 mg by mouth. 11/30/14 11/30/15 Yes Historical Provider, MD  fluticasone (FLONASE) 50 MCG/ACT nasal spray Place 1 spray into both nostrils every morning.   Yes Historical Provider, MD  hydrocortisone cream 1 % Apply 1 application topically as needed.  09/25/14  Yes Historical Provider, MD  insulin aspart (NOVOLOG) 100 UNIT/ML FlexPen Inject 15  Units into the skin daily.   Yes Historical Provider, MD  Insulin Detemir (LEVEMIR) 100 UNIT/ML Pen Inject 80 Units into the skin daily.   Yes Historical Provider, MD  losartan-hydrochlorothiazide (HYZAAR) 100-12.5 MG per tablet Take 1 tablet by mouth daily. 04/09/15  Yes Amy Rusty Aus, NP  metFORMIN (GLUMETZA) 1000 MG (MOD) 24 hr tablet Take one tablet in the am with half tablet in the pm.   Yes Historical Provider, MD  Multiple Vitamin (MULTIVITAMIN) capsule Take by mouth.   Yes Historical Provider, MD  naproxen sodium (ANAPROX) 220 MG tablet Take 220 mg by mouth 2 (two) times daily with a meal.   Yes Historical Provider, MD  nitroGLYCERIN (NITROSTAT) 0.4 MG SL tablet Place 0.4 mg under the tongue every 5 (five) minutes as needed for chest pain.   Yes Historical Provider, MD  omeprazole (PRILOSEC) 20 MG capsule Take 1 capsule (20 mg total) by mouth daily. 04/09/15  Yes Amy Rusty Aus, NP  ondansetron (ZOFRAN-ODT) 8 MG disintegrating tablet Take 8 mg by mouth.   Yes Historical Provider, MD  PROAIR HFA 108 (90 BASE) MCG/ACT inhaler INHALE 2 PUFFS 4 TIMES DAILY AS NEEDED 03/07/15  Yes Amy Rusty Aus, NP  simvastatin (ZOCOR) 20 MG tablet Take 20 mg by mouth daily.   Yes Historical Provider, MD  spironolactone (ALDACTONE) 25 MG tablet Take 25 mg by mouth. 06/11/15 06/10/16 Yes Historical Provider, MD    Allergies  Allergen Reactions  . Advil [Ibuprofen] Shortness Of Breath  Shortness of breath. Shortness of breath.  . Nsaids Shortness Of Breath    Past Surgical History  Procedure Laterality Date  . Tubal ligation    . Tonsillectomy    . Laparoscopic ovarian cystectomy    . Nasal sinus surgery      Social History  Substance Use Topics  . Smoking status: Former Smoker -- 0.25 packs/day for 10 years    Types: Cigarettes    Quit date: 11/04/1986  . Smokeless tobacco: Not on file  . Alcohol Use: No    Family History  Problem Relation Age of Onset  . Hypertension Mother   .  Hyperlipidemia Mother   . Hypertension Father   . Hyperlipidemia Father   . Hyperlipidemia Brother   . Hypertension Brother     Medication list has been reviewed and updated.  Physical Examination: BP 128/83 mmHg  Pulse 103  Temp(Src) 99.1 F (37.3 C) (Oral)  Resp 16  Ht  (1.6 m)  Wt 162 lb 12.8 oz (73.846 kg)  BMI 28.85 kg/m2  Physical Exam  Constitutional: She appears well-developed and well-nourished.  Neurological: She is alert.  Skin:  Patchy alopecia no scalp rash noted  Nursing note and vitals reviewed.   Assessment and Plan:  1. Alopecia Unclear etiology - Ambulatory referral to Dermatology  2. Essential hypertension Well controlled, continue current regimen  3. Controlled type 2 diabetes mellitus without complication, with long-term current use of insulin (HCC) Well controlled, followed by [facility] endo  4. GERD without esophagitis On chronic PPI and NSAID, discussed potential LT se's, consider discontinuation   Return in about 4 weeks (around 07/25/2015).  Dionne Ano. Kingsley Spittle MD [facility]  06/27/2015

## 2015-07-30 ENCOUNTER — Encounter: Payer: Self-pay | Admitting: Gastroenterology

## 2015-08-06 ENCOUNTER — Ambulatory Visit: Admitting: Family Medicine

## 2015-08-08 ENCOUNTER — Ambulatory Visit (INDEPENDENT_AMBULATORY_CARE_PROVIDER_SITE_OTHER): Admitting: Family Medicine

## 2015-08-08 ENCOUNTER — Encounter: Payer: Self-pay | Admitting: Family Medicine

## 2015-08-08 VITALS — BP 105/60 | HR 90 | Temp 98.6°F | Resp 16 | Ht 63.0 in | Wt 164.2 lb

## 2015-08-08 DIAGNOSIS — E113319 Type 2 diabetes mellitus with moderate nonproliferative diabetic retinopathy with macular edema, unspecified eye: Secondary | ICD-10-CM

## 2015-08-08 DIAGNOSIS — I10 Essential (primary) hypertension: Secondary | ICD-10-CM

## 2015-08-08 DIAGNOSIS — Z794 Long term (current) use of insulin: Secondary | ICD-10-CM | POA: Diagnosis not present

## 2015-08-08 DIAGNOSIS — Z23 Encounter for immunization: Secondary | ICD-10-CM

## 2015-08-08 DIAGNOSIS — L659 Nonscarring hair loss, unspecified: Secondary | ICD-10-CM | POA: Diagnosis not present

## 2015-08-08 NOTE — Patient Instructions (Signed)
Your goal blood pressure is 140/90. Work on low salt/sodium diet - goal <2.5gm (2,500mg) per day. Eat a diet high in fruits/vegetables and whole grains.  Look into mediterranean and DASH diet. Goal activity is 150min/wk of moderate intensity exercise.  This can be split into 30 minute chunks.  If you are not at this level, you can start with smaller 10-15 min increments and slowly build up activity. Look at www.heart.org for more resources  Please seek immediate medical attention at ER or Urgent Care if you develop: Chest pain, pressure or tightness. Shortness of breath accompanied by nausea or diaphoresis Visual changes Numbness or tingling on one side of the body Facial droop Altered mental status Or any concerning symptoms.  

## 2015-08-08 NOTE — Addendum Note (Signed)
Addended by: Elvina MattesPATEL, Taylormarie Register D on: 08/08/2015 01:48 PM   Modules accepted: Orders

## 2015-08-08 NOTE — Assessment & Plan Note (Signed)
Currently being treated by dermatology in scalp injections of steroids. Pt advised to watch sugars during these injections r/t DM

## 2015-08-08 NOTE — Progress Notes (Signed)
Subjective:    Patient ID: Megan Mooney, female    DOB: 11/17/1955, 59 y.o.   MRN: [id number]  HPI: Megan Mooney is a 59 y.o. female presenting on 08/08/2015 for Hypertension and Hyperlipidemia   Hyperlipidemia This is a chronic problem. The problem is controlled. Exacerbating diseases include diabetes. Pertinent negatives include no chest pain, focal weakness, leg pain, myalgias or shortness of breath. Current antihyperlipidemic treatment includes statins. The current treatment provides moderate improvement of lipids. There are no compliance problems.  Risk factors for coronary artery disease include diabetes mellitus, hypertension and post-menopausal.  Hypertension This is a chronic problem. The problem is controlled. Pertinent negatives include no chest pain, headaches, palpitations, peripheral edema or shortness of breath. Risk factors for coronary artery disease include diabetes mellitus, dyslipidemia and post-menopausal state. Past treatments include angiotensin blockers and diuretics. The current treatment provides moderate improvement.    Pt presents for hypertension follow-up. Doing well. No dizziness, visual changes.  Diabetes is doing well. [facility].  Alopecia: Saw a dermatologist for hair loss. Started on popacourt spray. Getting scalp injections of kenalog.  Nausea/Vomitting: Going to see [facility] for nausea and vomiting. Unsure if diabetes related.  Diabetic Retinopathy-macular edema. Seeing opthamologist every 8 weeks.  Desires flu shot. No refills needed today.   Past Medical History  Diagnosis Date  . Diabetes mellitus without complication (HCC)   . Hypertension   . Hyperlipidemia   . Depression     Current Outpatient Prescriptions on File Prior to Visit  Medication Sig  . ACCU-CHEK FASTCLIX LANCETS MISC   . aspirin 81 MG tablet Take 81 mg by mouth daily.  . cetirizine (ZYRTEC) 10 MG tablet TAKE 1 TABLET DAILY  . empagliflozin (JARDIANCE) 25 MG  TABS tablet Take 25 mg by mouth.  . fluticasone (FLONASE) 50 MCG/ACT nasal spray Place 1 spray into both nostrils every morning.  . hydrocortisone cream 1 % Apply 1 application topically as needed.   . insulin aspart (NOVOLOG) 100 UNIT/ML FlexPen Inject 15 Units into the skin daily.  . Insulin Detemir (LEVEMIR) 100 UNIT/ML Pen Inject 80 Units into the skin daily.  Marland Kitchen. losartan-hydrochlorothiazide (HYZAAR) 100-12.5 MG per tablet Take 1 tablet by mouth daily.  . metFORMIN (GLUMETZA) 1000 MG (MOD) 24 hr tablet Take one tablet in the am with half tablet in the pm.  . Multiple Vitamin (MULTIVITAMIN) capsule Take by mouth.  . naproxen sodium (ANAPROX) 220 MG tablet Take 220 mg by mouth 2 (two) times daily with a meal.  . nitroGLYCERIN (NITROSTAT) 0.4 MG SL tablet Place 0.4 mg under the tongue every 5 (five) minutes as needed for chest pain.  Marland Kitchen. omeprazole (PRILOSEC) 20 MG capsule Take 1 capsule (20 mg total) by mouth daily.  . ondansetron (ZOFRAN-ODT) 8 MG disintegrating tablet Take 8 mg by mouth.  Marland Kitchen. PROAIR HFA 108 (90 BASE) MCG/ACT inhaler INHALE 2 PUFFS 4 TIMES DAILY AS NEEDED  . simvastatin (ZOCOR) 20 MG tablet Take 20 mg by mouth daily.  Marland Kitchen. spironolactone (ALDACTONE) 25 MG tablet Take 25 mg by mouth.   No current facility-administered medications on file prior to visit.    Review of Systems  Constitutional: Negative for fever and chills.  HENT: Negative.   Respiratory: Negative for shortness of breath.   Cardiovascular: Negative for chest pain, palpitations and leg swelling.  Gastrointestinal: Positive for nausea and vomiting. Negative for abdominal pain and abdominal distention.  Endocrine: Negative for cold intolerance, heat intolerance, polydipsia, polyphagia and polyuria.  Genitourinary:  Negative.   Musculoskeletal: Negative.  Negative for myalgias.  Neurological: Negative for dizziness, focal weakness, numbness and headaches.  Psychiatric/Behavioral: Negative.    Per HPI unless  specifically indicated above     Objective:    BP 105/60 mmHg  Pulse 90  Temp(Src) 98.6 F (37 C) (Oral)  Resp 16  Ht  (1.6 m)  Wt 164 lb 3.2 oz (74.481 kg)  BMI 29.09 kg/m2  Wt Readings from Last 3 Encounters:  08/08/15 164 lb 3.2 oz (74.481 kg)  06/27/15 162 lb 12.8 oz (73.846 kg)  04/09/15 164 lb 3.2 oz (74.481 kg)    Physical Exam  Constitutional: She is oriented to person, place, and time. She appears well-developed and well-nourished. No distress.  Neck: Normal range of motion. Neck supple. No thyromegaly present.  Cardiovascular: Normal rate and regular rhythm.  Exam reveals no gallop and no friction rub.   No murmur heard. Pulmonary/Chest: Effort normal and breath sounds normal.  Abdominal: Soft. Bowel sounds are normal. There is no tenderness. There is no rebound.  Musculoskeletal: Normal range of motion. She exhibits no edema or tenderness.  Lymphadenopathy:    She has no cervical adenopathy.  Neurological: She is alert and oriented to person, place, and time.  Skin: Skin is warm and dry. She is not diaphoretic.   Results for orders placed or performed in visit on 08/08/15  HM DIABETES EYE EXAM  Result Value Ref Range   HM Diabetic Eye Exam Retinopathy (A) No Retinopathy      Assessment & Plan:   Problem List Items Addressed This Visit      Cardiovascular and Mediastinum   Essential hypertension - Primary    Controlled today.  ARB for renal protection.  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 Retinopathy being managed by opthalmology q2 mos.  HgA1c done at [facility]. Eye exam UTD. Foot exam UTD.         Musculoskeletal and Integument   Alopecia    Currently being treated by dermatology in scalp injections of steroids. Pt advised to watch sugars during these injections r/t DM         No orders of the defined types were  placed in this encounter.      Follow up plan: Return in about 6 months (around 02/05/2016) for HTN.

## 2015-08-08 NOTE — Assessment & Plan Note (Signed)
Managed at [facility]. Retinopathy being managed by opthalmology q2 mos.  [facility]. Eye exam UTD. Foot exam UTD.

## 2015-08-08 NOTE — Assessment & Plan Note (Signed)
Controlled today.  ARB for renal protection.  RTC 6 mos Labs next visit: CMP

## 2015-08-09 ENCOUNTER — Encounter: Payer: Self-pay | Admitting: Family Medicine

## 2015-08-13 ENCOUNTER — Telehealth: Payer: Self-pay | Admitting: Family Medicine

## 2015-08-13 DIAGNOSIS — Z1239 Encounter for other screening for malignant neoplasm of breast: Secondary | ICD-10-CM

## 2015-08-13 NOTE — Telephone Encounter (Signed)
Order for mammogram placed.

## 2015-08-27 ENCOUNTER — Ambulatory Visit
Admission: RE | Admit: 2015-08-27 | Discharge: 2015-08-27 | Disposition: A | Discharge status: Home / Self Care | Source: Ambulatory Visit | Attending: Family Medicine | Admitting: Family Medicine

## 2015-08-27 DIAGNOSIS — Z1231 Encounter for screening mammogram for malignant neoplasm of breast: Secondary | ICD-10-CM | POA: Diagnosis present

## 2015-08-27 DIAGNOSIS — Z1239 Encounter for other screening for malignant neoplasm of breast: Secondary | ICD-10-CM

## 2015-08-30 ENCOUNTER — Ambulatory Visit (INDEPENDENT_AMBULATORY_CARE_PROVIDER_SITE_OTHER): Admitting: Gastroenterology

## 2015-08-30 ENCOUNTER — Telehealth: Payer: Self-pay | Admitting: Gastroenterology

## 2015-08-30 ENCOUNTER — Encounter: Payer: Self-pay | Admitting: Gastroenterology

## 2015-08-30 VITALS — BP 98/60 | HR 88 | Ht 63.0 in | Wt 163.0 lb

## 2015-08-30 DIAGNOSIS — R112 Nausea with vomiting, unspecified: Secondary | ICD-10-CM

## 2015-08-30 NOTE — Telephone Encounter (Signed)
Called patient and gave her my [place] Email address to email colon report to me

## 2015-08-30 NOTE — Progress Notes (Signed)
Megan ReaperJanet Mooney    [id number]    09/15/1956  Primary Care Physician:Amy Diana EvesL Krebs, NP  Referring Physician: Loura PardonAmy Lauren Krebs, NP [address] [place], [place] [address]  Chief complaint:  Nausea and vomiting   HPI:  59 year old female with history of diabetes here with complaints of nausea and vomiting intermittently for the past 7-8 months. Her blood sugars have been better controlled in the past year since she started [facility]. Her A1c is currently 7.4 from 9 in the past. When she has symptoms she usually wakes up around 3 or 4 AM and throws up dinner from last night. She does check her blood sugar when she wakes up with nausea and usually 80-low 100's. She is on long-acting insulin Levemir and NovoLog before her largest meal which is late supper/ dinner. She doesn't throw up during the daytime. She lives salads and eats a lot of raw vegetables as well. Denies any heartburn, dysphagia or odynophagia. She takes of Aleve about once or twice a week. She is on daily omeprazole. She had EGD and [facility] in 2014 was unremarkable per patient, we'll try to obtain the records.   Outpatient Encounter Prescriptions as of 08/30/2015  Medication Sig  . ACCU-CHEK FASTCLIX LANCETS MISC   . aspirin 81 MG tablet Take 81 mg by mouth daily.  . cetirizine (ZYRTEC) 10 MG tablet TAKE 1 TABLET DAILY  . empagliflozin (JARDIANCE) 25 MG TABS tablet Take 25 mg by mouth.  . fluticasone (FLONASE) 50 MCG/ACT nasal spray Place 1 spray into both nostrils every morning.  . insulin aspart (NOVOLOG) 100 UNIT/ML FlexPen Inject 15 Units into the skin daily.  . Insulin Detemir (LEVEMIR) 100 UNIT/ML Pen Inject 80 Units into the skin daily.  Marland Kitchen. losartan-hydrochlorothiazide (HYZAAR) 100-12.5 MG per tablet Take 1 tablet by mouth daily.  . metFORMIN (GLUMETZA) 1000 MG (MOD) 24 hr tablet Take one tablet in the am with half tablet in the pm.  . Multiple Vitamin (MULTIVITAMIN)  capsule Take by mouth.  . naproxen sodium (ANAPROX) 220 MG tablet Take 220 mg by mouth 2 (two) times daily with a meal.  . omeprazole (PRILOSEC) 20 MG capsule Take 1 capsule (20 mg total) by mouth daily.  . ondansetron (ZOFRAN-ODT) 8 MG disintegrating tablet Take 8 mg by mouth.  Marland Kitchen. PROAIR HFA 108 (90 BASE) MCG/ACT inhaler INHALE 2 PUFFS 4 TIMES DAILY AS NEEDED  . simvastatin (ZOCOR) 20 MG tablet Take 20 mg by mouth daily.  Marland Kitchen. spironolactone (ALDACTONE) 25 MG tablet Take 25 mg by mouth.  . [DISCONTINUED] hydrocortisone cream 1 % Apply 1 application topically as needed.   . [DISCONTINUED] nitroGLYCERIN (NITROSTAT) 0.4 MG SL tablet Place 0.4 mg under the tongue every 5 (five) minutes as needed for chest pain.   No facility-administered encounter medications on file as of 08/30/2015.    Allergies as of 08/30/2015 - Review Complete 08/30/2015  Allergen Reaction Noted  . Advil [ibuprofen] Shortness Of Breath 10/31/2014  . Nsaids Shortness Of Breath 03/07/2015    Past Medical History  Diagnosis Date  . Diabetes mellitus without complication (HCC)   . Hypertension   . Hyperlipidemia   . Depression     Past Surgical History  Procedure Laterality Date  . Tubal ligation    . Tonsillectomy    . Laparoscopic ovarian cystectomy    . Nasal sinus surgery      Family History  Problem  Relation Age of Onset  . Hypertension Mother   . Hyperlipidemia Mother   . Hypertension Father   . Hyperlipidemia Father   . Hyperlipidemia Brother   . Hypertension Brother     Social History   Social History  . Marital Status: Married    Spouse Name: [name]  . Number of Children: [name]  . Years of Education: [name]   Occupational History  . Not on file.   Social History Main Topics  . Smoking status: Former Smoker -- 0.25 packs/day for 10 years    Types: Cigarettes    Quit date: 11/04/1986  . Smokeless tobacco: Not on file  . Alcohol Use: No  . Drug Use: No  . Sexual Activity: Not on file   Other  Topics Concern  . Not on file   Social History Narrative      Review of systems: Review of Systems  Constitutional: Negative for fever and chills.  HENT: Negative.   Eyes: Negative for blurred vision.  Respiratory: Negative for cough, shortness of breath and wheezing.   Cardiovascular: Negative for chest pain and palpitations.  Gastrointestinal: as per HPI Genitourinary: Negative for dysuria, urgency, frequency and hematuria.  Musculoskeletal: Negative for myalgias, back pain and joint pain.  Skin: Negative for itching and rash.  Neurological: Negative for dizziness, tremors, focal weakness, seizures and loss of consciousness.  Endo/Heme/Allergies: Negative for environmental allergies.  Psychiatric/Behavioral: Negative for depression, suicidal ideas and hallucinations.  All other systems reviewed and are negative.   Physical Exam: There were no vitals filed for this visit. Gen:      No acute distress HEENT:  EOMI, sclera anicteric Neck:     No masses; no thyromegaly Lungs:    Clear to auscultation bilaterally; normal respiratory effort CV:         Regular rate and rhythm; no murmurs Abd:      + bowel sounds; soft, non-tender; no palpable masses, no distension Ext:    No edema; adequate peripheral perfusion Skin:      Warm and dry; no rash Neuro: alert and oriented x 3 Psych: normal mood and affect  Data Reviewed:  Reviewed chart and data available in epic   Assessment and Plan/Recommendations:  59 year old female with history of diabetes here with complaints of intermittent nausea and vomiting mostly that wakes her up in the early hours and she vomits the meal from last night, suggestive of gastroparesis Advised patient to avoid fiber and low-fat diet; small frequent meals We will schedule for EGD to evaluate for possible peptic ulcer disease/partial gastric outlet obstruction given history of chronic nsaid Continue PPI Based on her symptoms to dietary modification  and EGD findings we'll consider gastric emptying scan Return in 3 months  K. Scherry Ran , MD [phone] Mon-Fri 8a-5p [phone] after 5p, weekends, holidays

## 2015-08-30 NOTE — Patient Instructions (Signed)
You have been scheduled for an endoscopy. Please follow written instructions given to you at your visit today. If you use inhalers (even only as needed), please bring them with you on the day of your procedure. Your physician has requested that you go to www.startemmi.com and enter the access code given to you at your visit today. This web site gives a general overview about your procedure. However, you should still follow specific instructions given to you by our office regarding your preparation for the procedure.  Gastroparesis diet given

## 2015-10-01 ENCOUNTER — Encounter: Payer: Self-pay | Admitting: Gastroenterology

## 2015-10-01 ENCOUNTER — Ambulatory Visit (AMBULATORY_SURGERY_CENTER): Admitting: Gastroenterology

## 2015-10-01 VITALS — BP 105/76 | HR 72 | Temp 96.8°F | Resp 13 | Ht 63.0 in | Wt 163.0 lb

## 2015-10-01 DIAGNOSIS — R112 Nausea with vomiting, unspecified: Secondary | ICD-10-CM

## 2015-10-01 MED ORDER — SODIUM CHLORIDE 0.9 % IV SOLN: 500.0000 mL | INTRAVENOUS | Status: DC

## 2015-10-01 NOTE — Op Note (Signed)
[facility] [address] [place], [id number]   ENDOSCOPY PROCEDURE REPORT  PATIENT: [name], Megan  MR#: [id number] BIRTHDATE: 08/19/1956 , 59  yrs. old GENDER: female ENDOSCOPIST: Marsa ArisKavitha Nandigam, MD REFERRED BY:  Gerre ScullAmy Krebbs NP PROCEDURE DATE:  10/01/2015 PROCEDURE:  EGD, diagnostic and EGD w/ biopsy for H.pylori ASA CLASS:     Class II INDICATIONS:  diagnostic procedure, dyspepsia, nausea, and vomiting.  MEDICATIONS: Propofol 150 mg IV TOPICAL ANESTHETIC: none  DESCRIPTION OF PROCEDURE: After the risks benefits and alternatives of the procedure were thoroughly explained, informed consent was obtained.  The LB [id number] endoscope was introduced through the mouth and advanced to the second portion of the duodenum , Without limitations.  The instrument was slowly withdrawn as the mucosa was fully examined.   EXAM: The esophagus and gastroesophageal junction were completely normal in appearance.  The stomach was entered and closely examined.The antrum, angularis, and lesser curvature were well visualized, including a retroflexed view of the cardia and fundus. The stomach wall was normally distensable.  The scope passed easily through the pylorus into the duodenum.  Retroflexed views revealed a small hiatal hernia.     The scope was then withdrawn from the patient and the procedure completed.  COMPLICATIONS: There were no immediate complications.  ENDOSCOPIC IMPRESSION: Normal appearing esophagus and GE junction, the stomach was well visualized and normal in appearance, normal appearing duodenum  RECOMMENDATIONS: 1.  Await biopsy results 2.  Anti-reflux regimen to be follow 3.  Continue PPI    eSigned:  Marsa ArisKavitha Nandigam, MD 10/01/2015 11:52 AM

## 2015-10-01 NOTE — Progress Notes (Signed)
Called to room to assist during endoscopic procedure.  Patient ID and intended procedure confirmed with present staff. Received instructions for my participation in the procedure from the performing physician.  

## 2015-10-01 NOTE — Progress Notes (Signed)
Patient awakening,vss,report to rn 

## 2015-10-01 NOTE — Patient Instructions (Signed)
Discharge instructions given. Biopsies taken. Resume previous medications. YOU HAD AN ENDOSCOPIC PROCEDURE TODAY AT THE [facility]:   Refer to the procedure report that was given to you for any specific questions about what was found during the examination.  If the procedure report does not answer your questions, please call your gastroenterologist to clarify.  If you requested that your care partner not be given the details of your procedure findings, then the procedure report has been included in a sealed envelope for you to review at your convenience later.  YOU SHOULD EXPECT: Some feelings of bloating in the abdomen. Passage of more gas than usual.  Walking can help get rid of the air that was put into your GI tract during the procedure and reduce the bloating. If you had a lower endoscopy (such as a colonoscopy or flexible sigmoidoscopy) you may notice spotting of blood in your stool or on the toilet paper. If you underwent a bowel prep for your procedure, you may not have a normal bowel movement for a few days.  Please Note:  You might notice some irritation and congestion in your nose or some drainage.  This is from the oxygen used during your procedure.  There is no need for concern and it should clear up in a day or so.  SYMPTOMS TO REPORT IMMEDIATELY:   Following upper endoscopy (EGD)  Vomiting of blood or coffee ground material  New chest pain or pain under the shoulder blades  Painful or persistently difficult swallowing  New shortness of breath  Fever of 100F or higher  Black, tarry-looking stools  For urgent or emergent issues, a gastroenterologist can be reached at any hour by calling [phone].   DIET: Your first meal following the procedure should be a small meal and then it is ok to progress to your normal diet. Heavy or fried foods are harder to digest and may make you feel nauseous or bloated.  Likewise, meals heavy in dairy and vegetables can increase  bloating.  Drink plenty of fluids but you should avoid alcoholic beverages for 24 hours.  ACTIVITY:  You should plan to take it easy for the rest of today and you should NOT DRIVE or use heavy machinery until tomorrow (because of the sedation medicines used during the test).    FOLLOW UP: Our staff will call the number listed on your records the next business day following your procedure to check on you and address any questions or concerns that you may have regarding the information given to you following your procedure. If we do not reach you, we will leave a message.  However, if you are feeling well and you are not experiencing any problems, there is no need to return our call.  We will assume that you have returned to your regular daily activities without incident.  If any biopsies were taken you will be contacted by phone or by letter within the next 1-3 weeks.  Please call us at [phone] if you have not heard about the biopsies in 3 weeks.    SIGNATURES/CONFIDENTIALITY: You and/or your care partner have signed paperwork which will be entered into your electronic medical record.  These signatures attest to the fact that that the information above on your After Visit Summary has been reviewed and is understood.  Full responsibility of the confidentiality of this discharge information lies with you and/or your care-partner. 

## 2015-10-02 ENCOUNTER — Telehealth: Payer: Self-pay | Admitting: *Deleted

## 2015-10-02 NOTE — Telephone Encounter (Signed)
  Follow up Call-  Call back number 10/01/2015  Post procedure Call Back phone  # [phone]  Permission to leave phone message Yes     Patient questions:  Do you have a fever, pain , or abdominal swelling? No. Pain Score  0 *  Have you tolerated food without any problems? Yes.    Have you been able to return to your normal activities? Yes.    Do you have any questions about your discharge instructions: Diet   No. Medications  No. Follow up visit  No.  Do you have questions or concerns about your Care? No.  Actions: * If pain score is 4 or above: No action needed, pain <4. Pt did c/o some headache this morning ,saying its probably from not taking diabetic medication and from not eating and drinking for awhile for procedure . Encouraged pt to eat and drink well this am and to keep a check on blood sugar today . Pt said blood sugar 127 this morning.

## 2015-10-10 ENCOUNTER — Encounter: Payer: Self-pay | Admitting: Family Medicine

## 2015-10-10 ENCOUNTER — Ambulatory Visit
Admission: RE | Admit: 2015-10-10 | Discharge: 2015-10-10 | Disposition: A | Discharge status: Home / Self Care | Source: Ambulatory Visit | Attending: Family Medicine | Admitting: Family Medicine

## 2015-10-10 ENCOUNTER — Ambulatory Visit (INDEPENDENT_AMBULATORY_CARE_PROVIDER_SITE_OTHER): Admitting: Family Medicine

## 2015-10-10 VITALS — BP 121/77 | HR 91 | Temp 98.3°F | Resp 16 | Ht 63.0 in | Wt 165.5 lb

## 2015-10-10 DIAGNOSIS — M25511 Pain in right shoulder: Secondary | ICD-10-CM

## 2015-10-10 DIAGNOSIS — S46811A Strain of other muscles, fascia and tendons at shoulder and upper arm level, right arm, initial encounter: Secondary | ICD-10-CM

## 2015-10-10 MED ORDER — NAPROXEN 500 MG PO TABS: 500.0000 mg | ORAL_TABLET | Freq: Two times a day (BID) | ORAL | Status: DC

## 2015-10-10 MED ORDER — CYCLOBENZAPRINE HCL 10 MG PO TABS: 10.0000 mg | ORAL_TABLET | Freq: Every day | ORAL | Status: DC

## 2015-10-10 NOTE — Progress Notes (Signed)
Subjective:    Patient ID: Megan Mooney, female    DOB: 1956-01-21, 60 y.o.   MRN: [id number]  HPI: Megan Mooney is a 60 y.o. female presenting on 10/10/2015 for Shoulder Pain   HPI  Pt presents for R shoulder pain that radiates up neck and back of head. Shoulder pain began 1 month ago. HA started last week. No known shoulder injury. No repetive motions. She tried fixing her posture. Feels pulling when she reaches around her head.  Home treatment: Aspercream, bengay, icy hot, heating pad. Aleve for HA at night. Aleve helps with HA.  Has history of ibuprofen allergy but can take aleve without issue.   Past Medical History  Diagnosis Date  . Diabetes mellitus without complication (HCC)   . Hypertension   . Hyperlipidemia   . Depression   . GERD (gastroesophageal reflux disease)   . Allergy     seasonal, uses inhaler during this time    Current Outpatient Prescriptions on File Prior to Visit  Medication Sig  . ACCU-CHEK FASTCLIX LANCETS MISC   . aspirin 81 MG tablet Take 81 mg by mouth daily.  . cetirizine (ZYRTEC) 10 MG tablet TAKE 1 TABLET DAILY  . Cholecalciferol (VITAMIN D3) 50000 units CAPS Take 1 capsule by mouth once a week.  . empagliflozin (JARDIANCE) 25 MG TABS tablet Take 25 mg by mouth.  . fluticasone (FLONASE) 50 MCG/ACT nasal spray Place 1 spray into both nostrils every morning. Reported on 10/01/2015  . insulin aspart (NOVOLOG) 100 UNIT/ML FlexPen Inject 15 Units into the skin daily.  . Insulin Detemir (LEVEMIR) 100 UNIT/ML Pen Inject 80 Units into the skin daily.  Marland Kitchen losartan-hydrochlorothiazide (HYZAAR) 100-12.5 MG per tablet Take 1 tablet by mouth daily.  . metFORMIN (GLUMETZA) 1000 MG (MOD) 24 hr tablet Take one tablet in the am with half tablet in the pm.  . Multiple Vitamin (MULTIVITAMIN) capsule Take by mouth.  . naproxen sodium (ANAPROX) 220 MG tablet Take 220 mg by mouth 2 (two) times daily with a meal.  . omeprazole (PRILOSEC) 20 MG capsule Take 1 capsule (20  mg total) by mouth daily.  . ondansetron (ZOFRAN-ODT) 8 MG disintegrating tablet Take 8 mg by mouth. Reported on 10/01/2015  . PROAIR HFA 108 (90 BASE) MCG/ACT inhaler INHALE 2 PUFFS 4 TIMES DAILY AS NEEDED  . simvastatin (ZOCOR) 20 MG tablet Take 20 mg by mouth daily.  Marland Kitchen spironolactone (ALDACTONE) 25 MG tablet Take 25 mg by mouth.   No current facility-administered medications on file prior to visit.    Review of Systems  Constitutional: Negative for fever and chills.  Respiratory: Negative for cough, chest tightness and wheezing.   Cardiovascular: Negative for chest pain and leg swelling.  Gastrointestinal: Negative for nausea, vomiting, abdominal pain, diarrhea and constipation.  Endocrine: Negative.  Negative for cold intolerance, heat intolerance, polydipsia, polyphagia and polyuria.  Genitourinary: Negative for dysuria and difficulty urinating.  Musculoskeletal: Positive for arthralgias and neck pain. Negative for neck stiffness.  Neurological: Positive for headaches. Negative for dizziness, light-headedness and numbness.  Psychiatric/Behavioral: Negative.    Per HPI unless specifically indicated above     Objective:    BP 121/77 mmHg  Pulse 91  Temp(Src) 98.3 F (36.8 C) (Oral)  Resp 16  Ht  (1.6 m)  Wt 165 lb 8 oz (75.07 kg)  BMI 29.32 kg/m2  Wt Readings from Last 3 Encounters:  10/10/15 165 lb 8 oz (75.07 kg)  10/01/15 163 lb (73.936 kg)  08/30/15  163 lb (73.936 kg)    Physical Exam  Constitutional: She is oriented to person, place, and time. She appears well-developed and well-nourished.  HENT:  Head: Normocephalic and atraumatic.  Neck: Neck supple.  Cardiovascular: Normal rate, regular rhythm and normal heart sounds.  Exam reveals no gallop and no friction rub.   No murmur heard. Pulmonary/Chest: Effort normal and breath sounds normal. She has no wheezes. She exhibits no tenderness.  Abdominal: Soft. Normal appearance and bowel sounds are normal. She  exhibits no distension and no mass. There is no tenderness. There is no rebound and no guarding.  Musculoskeletal: Normal range of motion. She exhibits no edema.       Right shoulder: She exhibits tenderness (muscular- R shoulder.) and bony tenderness (along the clavicle. ). She exhibits normal range of motion.       Cervical back: She exhibits tenderness. She exhibits normal range of motion, no bony tenderness, no swelling and no edema.       Back:  Lymphadenopathy:    She has no cervical adenopathy.  Neurological: She is alert and oriented to person, place, and time.  Skin: Skin is warm and dry.   Results for orders placed or performed in visit on 08/08/15  HM DIABETES EYE EXAM  Result Value Ref Range   HM Diabetic Eye Exam Retinopathy (A) No Retinopathy      Assessment & Plan:   Problem List Items Addressed This Visit    None    Visit Diagnoses    Trapezius muscle strain, right, initial encounter    -  Primary    Pain is likely MSK strain. Tension in the trapezius R side. Heat, massage, high dose NSAIDs. Pt can take aleve without issues. Flexeril PRN for at night.     Relevant Medications    naproxen (NAPROSYN) 500 MG tablet    cyclobenzaprine (FLEXERIL) 10 MG tablet    Right anterior shoulder pain        XR rule out fracture. Some mild arthritis changes.  Pain is likely MSK. Consider ortho referral or imaging with continued symptoms.     Relevant Orders    DG Shoulder Right (Completed)    DG Clavicle Right (Completed)       Meds ordered this encounter  Medications  . naproxen (NAPROSYN) 500 MG tablet    Sig: Take 1 tablet (500 mg total) by mouth 2 (two) times daily with a meal.    Dispense:  20 tablet    Refill:  0    Order Specific Question:  Supervising Provider    Answer:  Janeann Forehand [phone]  . cyclobenzaprine (FLEXERIL) 10 MG tablet    Sig: Take 1 tablet (10 mg total) by mouth at bedtime.    Dispense:  30 tablet    Refill:  0    Order Specific  Question:  Supervising Provider    Answer:  Janeann Forehand [[id number]]      Follow up plan: Return in about 2 weeks (around 10/24/2015) for neck pain. Marland Kitchen

## 2015-10-10 NOTE — Patient Instructions (Signed)
We will get imaging of your shoulder to ensure nothing going on with the bones. I think it is mostly muscle related. Heat and massage can relieve the tension in your shoulder and neck.  Gentle stretching of the arm.  Take naprosyn twice daily for 10 days to see if that relieves the pain. Take flexeril at bedtime to help with pain as well.

## 2015-10-14 ENCOUNTER — Encounter: Payer: Self-pay | Admitting: Gastroenterology

## 2015-10-15 ENCOUNTER — Encounter: Admitting: Family Medicine

## 2015-10-15 ENCOUNTER — Ambulatory Visit (INDEPENDENT_AMBULATORY_CARE_PROVIDER_SITE_OTHER): Admitting: Family Medicine

## 2015-10-15 VITALS — BP 111/81 | HR 98 | Temp 98.3°F | Resp 16 | Ht 63.0 in | Wt 164.4 lb

## 2015-10-15 DIAGNOSIS — Z794 Long term (current) use of insulin: Secondary | ICD-10-CM

## 2015-10-15 DIAGNOSIS — Z205 Contact with and (suspected) exposure to viral hepatitis: Secondary | ICD-10-CM

## 2015-10-15 DIAGNOSIS — Z01419 Encounter for gynecological examination (general) (routine) without abnormal findings: Secondary | ICD-10-CM | POA: Diagnosis not present

## 2015-10-15 DIAGNOSIS — I10 Essential (primary) hypertension: Secondary | ICD-10-CM | POA: Diagnosis not present

## 2015-10-15 DIAGNOSIS — E113319 Type 2 diabetes mellitus with moderate nonproliferative diabetic retinopathy with macular edema, unspecified eye: Secondary | ICD-10-CM

## 2015-10-15 NOTE — Assessment & Plan Note (Addendum)
CMET ordered for routine labs. Next follow-up in May of 2017.

## 2015-10-15 NOTE — Progress Notes (Signed)
Subjective:    Patient ID: Megan Mooney, female    DOB: 09/29/1955, 60 y.o.   MRN: [id number]  HPI: Megan Mooney is a 60 y.o. female presenting on 10/15/2015 for Annual Exam   HPI  Pt presents for annual exam. Overall doing well. Last pap was 08/2018.  She is wondering about Zostavax-  Pt has an issue with anal fissures- irritation with no bleeding.   Colonoscopy 2013   Past Medical History  Diagnosis Date  . Diabetes mellitus without complication (HCC)   . Hypertension   . Hyperlipidemia   . Depression   . GERD (gastroesophageal reflux disease)   . Allergy     seasonal, uses inhaler during this time   Social History   Social History  . Marital Status: Married    Spouse Name: [name]  . Number of Children: [name]  . Years of Education: [name]   Occupational History  . Not on file.   Social History Main Topics  . Smoking status: Former Smoker -- 0.25 packs/day for 10 years    Types: Cigarettes    Quit date: 11/04/1986  . Smokeless tobacco: Never Used  . Alcohol Use: No  . Drug Use: No  . Sexual Activity: Not on file   Other Topics Concern  . Not on file   Social History Narrative   Family History  Problem Relation Age of Onset  . Hypertension Mother   . Hyperlipidemia Mother   . Hypertension Father   . Hyperlipidemia Father   . Hyperlipidemia Brother   . Hypertension Brother   . Colon cancer Neg Hx   . Esophageal cancer Neg Hx   . Stomach cancer Neg Hx    Current Outpatient Prescriptions on File Prior to Visit  Medication Sig  . ACCU-CHEK FASTCLIX LANCETS MISC   . aspirin 81 MG tablet Take 81 mg by mouth daily.  . cetirizine (ZYRTEC) 10 MG tablet TAKE 1 TABLET DAILY  . Cholecalciferol (VITAMIN D3) 50000 units CAPS Take 1 capsule by mouth once a week.  . cyclobenzaprine (FLEXERIL) 10 MG tablet Take 1 tablet (10 mg total) by mouth at bedtime.  . empagliflozin (JARDIANCE) 25 MG TABS tablet Take 25 mg by mouth.  . fluticasone (FLONASE) 50 MCG/ACT nasal spray  Place 1 spray into both nostrils every morning. Reported on 10/01/2015  . insulin aspart (NOVOLOG) 100 UNIT/ML FlexPen Inject 15 Units into the skin daily.  . Insulin Detemir (LEVEMIR) 100 UNIT/ML Pen Inject 80 Units into the skin daily.  Marland Kitchen losartan-hydrochlorothiazide (HYZAAR) 100-12.5 MG per tablet Take 1 tablet by mouth daily.  . metFORMIN (GLUMETZA) 1000 MG (MOD) 24 hr tablet Take one tablet in the am with half tablet in the pm.  . Multiple Vitamin (MULTIVITAMIN) capsule Take by mouth.  . naproxen (NAPROSYN) 500 MG tablet Take 1 tablet (500 mg total) by mouth 2 (two) times daily with a meal.  . naproxen sodium (ANAPROX) 220 MG tablet Take 220 mg by mouth 2 (two) times daily with a meal.  . omeprazole (PRILOSEC) 20 MG capsule Take 1 capsule (20 mg total) by mouth daily.  . ondansetron (ZOFRAN-ODT) 8 MG disintegrating tablet Take 8 mg by mouth. Reported on 10/01/2015  . PROAIR HFA 108 (90 BASE) MCG/ACT inhaler INHALE 2 PUFFS 4 TIMES DAILY AS NEEDED  . simvastatin (ZOCOR) 20 MG tablet Take 20 mg by mouth daily.  Marland Kitchen spironolactone (ALDACTONE) 25 MG tablet Take 25 mg by mouth.   No current facility-administered medications on file prior to  visit.    Review of Systems  Constitutional: Negative for fever and chills.  HENT: Negative.   Respiratory: Negative for cough, chest tightness and wheezing.   Cardiovascular: Negative for chest pain and leg swelling.  Gastrointestinal: Negative for nausea, vomiting, abdominal pain, diarrhea, constipation, blood in stool and anal bleeding (anal irritation).  Endocrine: Negative.  Negative for cold intolerance, heat intolerance, polydipsia, polyphagia and polyuria.  Genitourinary: Negative for dysuria, urgency, decreased urine volume, vaginal bleeding, vaginal discharge, difficulty urinating, vaginal pain, pelvic pain and dyspareunia.  Musculoskeletal: Negative.   Neurological: Negative for dizziness, light-headedness and numbness.  Psychiatric/Behavioral:  Negative.    Per HPI unless specifically indicated above     Objective:    BP 111/81 mmHg  Pulse 98  Temp(Src) 98.3 F (36.8 C) (Oral)  Resp 16  Ht  (1.6 m)  Wt 164 lb 6.4 oz (74.571 kg)  BMI 29.13 kg/m2  Wt Readings from Last 3 Encounters:  10/15/15 164 lb 6.4 oz (74.571 kg)  10/10/15 165 lb 8 oz (75.07 kg)  10/01/15 163 lb (73.936 kg)    Physical Exam  Constitutional: She is oriented to person, place, and time. She appears well-developed and well-nourished.  HENT:  Head: Normocephalic and atraumatic.  Neck: Normal range of motion. Neck supple. No thyromegaly present.  Cardiovascular: Normal rate and regular rhythm.  Exam reveals no gallop and no friction rub.   No murmur heard. Pulmonary/Chest: Breath sounds normal. No respiratory distress. She has no wheezes.  Abdominal: Soft. Bowel sounds are normal. She exhibits no distension. There is no tenderness.  Genitourinary: Vagina normal and uterus normal. Rectal exam shows external hemorrhoid. Rectal exam shows no fissure and no tenderness.    No breast swelling, tenderness or discharge. There is no rash or tenderness on the right labia. There is no rash, tenderness or lesion on the left labia. Uterus is not tender. Cervix exhibits no motion tenderness. Right adnexum displays no mass, no tenderness and no fullness. Left adnexum displays no mass, no tenderness and no fullness. No tenderness in the vagina.  Musculoskeletal: Normal range of motion. She exhibits no edema or tenderness.  Lymphadenopathy:    She has no cervical adenopathy.  Neurological: She is alert and oriented to person, place, and time.  Skin: Skin is warm and dry.  Psychiatric: She has a normal mood and affect. Her behavior is normal. Judgment normal.   Results for orders placed or performed in visit on 08/08/15  HM DIABETES EYE EXAM  Result Value Ref Range   HM Diabetic Eye Exam Retinopathy (A) No Retinopathy      Assessment & Plan:   Problem List  Items Addressed This Visit      Cardiovascular and Mediastinum   Essential hypertension    CMET ordered for routine labs. Next follow-up in May of 2017.       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 Hep C testing order. Pt had up to date dexa scan at the [place] in 2016.     Exposure to hepatitis C        Requested testing today.     Relevant Orders    Hepatitis  C Antibody       No orders of the defined types were placed in this encounter.      Follow up plan: No Follow-up on file.

## 2015-10-15 NOTE — Patient Instructions (Signed)

## 2015-10-16 LAB — COMPREHENSIVE METABOLIC PANEL
A/G RATIO: 1.8 (ref 1.1–2.5)
ALBUMIN: 4.9 g/dL — AB (ref 3.6–4.8)
ALT: 14 IU/L (ref 0–32)
AST: 20 IU/L (ref 0–40)
Alkaline Phosphatase: 83 IU/L (ref 39–117)
BUN/Creatinine Ratio: 14 (ref 11–26)
BUN: 13 mg/dL (ref 8–27)
Bilirubin Total: 0.5 mg/dL (ref 0.0–1.2)
CALCIUM: 10.7 mg/dL — AB (ref 8.7–10.3)
CO2: 23 mmol/L (ref 18–29)
Chloride: 100 mmol/L (ref 96–106)
Creatinine, Ser: 0.94 mg/dL (ref 0.57–1.00)
GFR, EST AFRICAN AMERICAN: 76 mL/min/{1.73_m2} (ref >59)
GFR, EST NON AFRICAN AMERICAN: 66 mL/min/{1.73_m2} (ref >59)
Globulin, Total: 2.8 g/dL (ref 1.5–4.5)
Glucose: 70 mg/dL (ref 65–99)
POTASSIUM: 4.5 mmol/L (ref 3.5–5.2)
Sodium: 144 mmol/L (ref 134–144)
TOTAL PROTEIN: 7.7 g/dL (ref 6.0–8.5)

## 2015-10-16 LAB — LIPID PANEL
CHOLESTEROL TOTAL: 186 mg/dL (ref 100–199)
Chol/HDL Ratio: 3.4 ratio units (ref 0.0–4.4)
HDL: 54 mg/dL (ref >39)
LDL CALC: 93 mg/dL (ref 0–99)
TRIGLYCERIDES: 197 mg/dL — AB (ref 0–149)
VLDL Cholesterol Cal: 39 mg/dL (ref 5–40)

## 2015-10-16 LAB — HEPATITIS C ANTIBODY: Hep C Virus Ab: <0.1 s/co ratio (ref 0.0–0.9)

## 2015-11-28 ENCOUNTER — Encounter: Payer: Self-pay | Admitting: Gastroenterology

## 2015-11-28 ENCOUNTER — Ambulatory Visit (INDEPENDENT_AMBULATORY_CARE_PROVIDER_SITE_OTHER): Admitting: Gastroenterology

## 2015-11-28 VITALS — BP 100/78 | HR 72 | Ht 63.75 in | Wt 168.2 lb

## 2015-11-28 DIAGNOSIS — K649 Unspecified hemorrhoids: Secondary | ICD-10-CM | POA: Diagnosis not present

## 2015-11-28 DIAGNOSIS — K219 Gastro-esophageal reflux disease without esophagitis: Secondary | ICD-10-CM | POA: Diagnosis not present

## 2015-11-28 MED ORDER — OMEPRAZOLE 20 MG PO CPDR: 20.0000 mg | DELAYED_RELEASE_CAPSULE | Freq: Every day | ORAL | Status: DC

## 2015-11-28 NOTE — Progress Notes (Signed)
Megan Mooney    [id number]    01-Jan-1956  Primary Care Physician:Amy Diana Eves, NP  Referring Physician: Loura Pardon, NP [address] [place], [place] [address]  Chief complaint: Hemorrhoids  HPI: 60 year old female here for follow-up visit. She reports improvement of nausea and intermittent vomiting once she started taking PPI in the morning, she no longer has any episodes of nausea. She is taking PPI 30 minutes before breakfast every day and following antireflux measures. Denies dysphagia, odynophagia or abdominal pain. On review of system complained of intermittent flareup from her hemorrhoids predominantly burning sensation and itching about once or twice a month. Denies any blood per rectum. She had normal colonoscopy in July 2013. Normal EGD on 10/01/2015.   Outpatient Encounter Prescriptions as of 11/28/2015  Medication Sig  . ACCU-CHEK FASTCLIX LANCETS MISC   . aspirin 81 MG tablet Take 81 mg by mouth daily.  . cetirizine (ZYRTEC) 10 MG tablet TAKE 1 TABLET DAILY  . cyclobenzaprine (FLEXERIL) 10 MG tablet Take 1 tablet (10 mg total) by mouth at bedtime. (Patient taking differently: Take 10 mg by mouth as needed. )  . empagliflozin (JARDIANCE) 25 MG TABS tablet Take 25 mg by mouth.  . fluticasone (FLONASE) 50 MCG/ACT nasal spray Place 1 spray into both nostrils every morning. Reported on 10/01/2015  . insulin aspart (NOVOLOG) 100 UNIT/ML FlexPen Inject 15 Units into the skin daily.  . Insulin Detemir (LEVEMIR) 100 UNIT/ML Pen Inject 80 Units into the skin daily.  Marland Kitchen losartan-hydrochlorothiazide (HYZAAR) 100-12.5 MG per tablet Take 1 tablet by mouth daily.  . metFORMIN (GLUMETZA) 1000 MG (MOD) 24 hr tablet Take one tablet in the am with half tablet in the pm.  . naproxen (NAPROSYN) 500 MG tablet Take 1 tablet (500 mg total) by mouth 2 (two) times daily with a meal. (Patient taking differently: Take 500 mg by mouth as needed. )  . naproxen sodium (ANAPROX) 220 MG tablet Take  220 mg by mouth 2 (two) times daily with a meal.  . omeprazole (PRILOSEC) 20 MG capsule Take 1 capsule (20 mg total) by mouth daily.  . ondansetron (ZOFRAN-ODT) 8 MG disintegrating tablet Take 8 mg by mouth. Reported on 10/01/2015  . PROAIR HFA 108 (90 BASE) MCG/ACT inhaler INHALE 2 PUFFS 4 TIMES DAILY AS NEEDED  . simvastatin (ZOCOR) 20 MG tablet Take 20 mg by mouth daily.  Marland Kitchen spironolactone (ALDACTONE) 25 MG tablet Take 25 mg by mouth daily.   . [DISCONTINUED] omeprazole (PRILOSEC) 20 MG capsule Take 1 capsule (20 mg total) by mouth daily.  . [DISCONTINUED] Cholecalciferol (VITAMIN D3) 50000 units CAPS Take 1 capsule by mouth once a week.  . [DISCONTINUED] Multiple Vitamin (MULTIVITAMIN) capsule Take by mouth.   No facility-administered encounter medications on file as of 11/28/2015.    Allergies as of 11/28/2015 - Review Complete 11/28/2015  Allergen Reaction Noted  . Advil [ibuprofen] Shortness Of Breath 10/31/2014  . Nsaids Shortness Of Breath 03/07/2015    Past Medical History  Diagnosis Date  . Diabetes mellitus without complication (HCC)   . Hypertension   . Hyperlipidemia   . Depression   . GERD (gastroesophageal reflux disease)   . Allergy     seasonal, uses inhaler during this time  . Gastropathy     Past Surgical History  Procedure Laterality Date  . Tubal ligation    . Tonsillectomy    . Laparoscopic ovarian cystectomy    . Nasal  sinus surgery      Family History  Problem Relation Age of Onset  . Hypertension Mother   . Hyperlipidemia Mother   . Hypertension Father   . Hyperlipidemia Father   . Hyperlipidemia Brother   . Hypertension Brother   . Colon cancer Neg Hx   . Esophageal cancer Neg Hx   . Stomach cancer Neg Hx     Social History   Social History  . Marital Status: Married    Spouse Name: [name]  . Number of Children: [name]  . Years of Education: [name]   Occupational History  . Not on file.   Social History Main Topics  . Smoking status:  Former Smoker -- 0.25 packs/day for 10 years    Types: Cigarettes    Quit date: 11/04/1986  . Smokeless tobacco: Never Used  . Alcohol Use: No  . Drug Use: No  . Sexual Activity: Not on file   Other Topics Concern  . Not on file   Social History Narrative      Review of systems: Review of Systems  Constitutional: Negative for fever and chills.  HENT: Negative.   Eyes: Negative for blurred vision.  Respiratory: Negative for cough, shortness of breath and wheezing.   Cardiovascular: Negative for chest pain and palpitations.  Gastrointestinal: as per HPI Genitourinary: Negative for dysuria, urgency, frequency and hematuria.  Musculoskeletal: Negative for myalgias, back pain and joint pain.  Skin: Negative for itching and rash.  Neurological: Negative for dizziness, tremors, focal weakness, seizures and loss of consciousness.  Endo/Heme/Allergies: Negative for environmental allergies.  Psychiatric/Behavioral: Negative for depression, suicidal ideas and hallucinations.  All other systems reviewed and are negative.   Physical Exam: Filed Vitals:   11/28/15 0919  BP: 100/78  Pulse: 72   Gen:      No acute distress HEENT:  EOMI, sclera anicteric Neck:     No masses; no thyromegaly Lungs:    Clear to auscultation bilaterally; normal respiratory effort CV:         Regular rate and rhythm; no murmurs Abd:      + bowel sounds; soft, non-tender; no palpable masses, no distension Ext:    No edema; adequate peripheral perfusion Skin:      Warm and dry; no rash Neuro: alert and oriented x 3 Psych: normal mood and affect Rectal exam: Normal anal sphincter tone, no anal fissure or external hemorrhoids Anoscopy: Small internal hemorrhoidsx 1 column, no active bleeding, normal dentate line, no visible nodules  Data Reviewed: Reviewed chart in epic and discussed with patient   Assessment and Plan/Recommendations:  60 year old female for follow-up visit Nausea and vomiting  resolved GERD: Continue PPI daily 30 minutes before breakfast and follow antireflux measures Hemorrhoids: Okay to use hydrocortisone rectal cream as needed and we'll schedule for hemorrhoidal banding Due for recall screening colonoscopy in 2023  K. Scherry RanVeena Antuan Limes , MD [phone] Mon-Fri 8a-5p [phone] after 5p, weekends, holidays

## 2015-11-28 NOTE — Patient Instructions (Signed)
Your hemorrhoidal banding appointment is scheduled on 01/30/2016 at  3:45pm  We will refill your Omeprazole today

## 2015-11-29 ENCOUNTER — Telehealth: Payer: Self-pay | Admitting: *Deleted

## 2015-11-29 NOTE — Telephone Encounter (Signed)
Called patient to inform we can give her 4-4 appointment for banding, she stated she wanted to keep her banding appointment as scheduled on 01/30/2016. At 2pm   She will be out of town on 4-4

## 2015-12-10 LAB — HEMOGLOBIN A1C: Hemoglobin A1C: 7.9

## 2015-12-24 ENCOUNTER — Encounter: Admitting: Gastroenterology

## 2016-01-30 ENCOUNTER — Encounter: Admitting: Gastroenterology

## 2016-02-06 ENCOUNTER — Encounter: Payer: Self-pay | Admitting: Family Medicine

## 2016-02-06 ENCOUNTER — Ambulatory Visit (INDEPENDENT_AMBULATORY_CARE_PROVIDER_SITE_OTHER): Admitting: Family Medicine

## 2016-02-06 VITALS — BP 102/66 | HR 80 | Temp 98.1°F | Resp 16 | Ht 63.0 in | Wt 165.4 lb

## 2016-02-06 DIAGNOSIS — J301 Allergic rhinitis due to pollen: Secondary | ICD-10-CM

## 2016-02-06 DIAGNOSIS — Z794 Long term (current) use of insulin: Secondary | ICD-10-CM

## 2016-02-06 DIAGNOSIS — E113319 Type 2 diabetes mellitus with moderate nonproliferative diabetic retinopathy with macular edema, unspecified eye: Secondary | ICD-10-CM

## 2016-02-06 DIAGNOSIS — I10 Essential (primary) hypertension: Secondary | ICD-10-CM | POA: Diagnosis not present

## 2016-02-06 LAB — POCT UA - MICROALBUMIN
Albumin/Creatinine Ratio, Urine, POC: 0
Creatinine, POC: 0 mg/dL
Microalbumin Ur, POC: 0 mg/L

## 2016-02-06 MED ORDER — LOSARTAN POTASSIUM-HCTZ 100-12.5 MG PO TABS: 1.0000 | ORAL_TABLET | Freq: Every day | ORAL | Status: DC

## 2016-02-06 MED ORDER — PROAIR HFA 108 (90 BASE) MCG/ACT IN AERS: INHALATION_SPRAY | RESPIRATORY_TRACT | Status: DC

## 2016-02-06 NOTE — Progress Notes (Signed)
Subjective:    Patient ID: Megan Mooney, female    DOB: Feb 09, 1956, 60 y.o.   MRN: [id number]  HPI: Megan Mooney is a 60 y.o. female presenting on 02/06/2016 for Hypertension   HPI  Pt presents for hypertension follow-up. Doing well at home. No dizziness. BP on low side today but doing well. Taking aldactone and losartan HCTZ at home.  No CP, SOB, not feeling like she is going to pass out.  Recheck of CA needed today. Was elevated on last exam.  Has DM- managed at [facility] endocrinology. Needs urine micro-albumin today. Last A1c was 7.9%. No medication changes were made per patient report.   No formal diagnosis of asthma but uses inhaler PRN during allergy season. Has asked for a just in case refill today.   Past Medical History  Diagnosis Date  . Diabetes mellitus without complication (HCC)   . Hypertension   . Hyperlipidemia   . Depression   . GERD (gastroesophageal reflux disease)   . Allergy     seasonal, uses inhaler during this time  . Gastropathy    Social History   Social History  . Marital Status: Married    Spouse Name: [name]  . Number of Children: [name]  . Years of Education: [name]   Occupational History  . Not on file.   Social History Main Topics  . Smoking status: Former Smoker -- 0.25 packs/day for 10 years    Types: Cigarettes    Quit date: 11/04/1986  . Smokeless tobacco: Never Used  . Alcohol Use: No  . Drug Use: No  . Sexual Activity: Not on file   Other Topics Concern  . Not on file   Social History Narrative   Family History  Problem Relation Age of Onset  . Hypertension Mother   . Hyperlipidemia Mother   . Hypertension Father   . Hyperlipidemia Father   . Hyperlipidemia Brother   . Hypertension Brother   . Colon cancer Neg Hx   . Esophageal cancer Neg Hx   . Stomach cancer Neg Hx    Current Outpatient Prescriptions on File Prior to Visit  Medication Sig  . ACCU-CHEK FASTCLIX LANCETS MISC   . aspirin 81 MG tablet Take 81 mg by mouth daily.  .  cetirizine (ZYRTEC) 10 MG tablet TAKE 1 TABLET DAILY  . cyclobenzaprine (FLEXERIL) 10 MG tablet Take 1 tablet (10 mg total) by mouth at bedtime. (Patient taking differently: Take 10 mg by mouth as needed. )  . fluticasone (FLONASE) 50 MCG/ACT nasal spray Place 1 spray into both nostrils every morning. Reported on 10/01/2015  . insulin aspart (NOVOLOG) 100 UNIT/ML FlexPen Inject 15 Units into the skin daily.  . Insulin Detemir (LEVEMIR) 100 UNIT/ML Pen Inject 80 Units into the skin daily.  . metFORMIN (GLUMETZA) 1000 MG (MOD) 24 hr tablet Take one tablet in the am with half tablet in the pm.  . naproxen (NAPROSYN) 500 MG tablet Take 1 tablet (500 mg total) by mouth 2 (two) times daily with a meal. (Patient taking differently: Take 500 mg by mouth as needed. )  . naproxen sodium (ANAPROX) 220 MG tablet Take 220 mg by mouth 2 (two) times daily with a meal.  . omeprazole (PRILOSEC) 20 MG capsule Take 1 capsule (20 mg total) by mouth daily.  . ondansetron (ZOFRAN-ODT) 8 MG disintegrating tablet Take 8 mg by mouth. Reported on 10/01/2015  . simvastatin (ZOCOR) 20 MG tablet Take 20 mg by mouth daily.  Marland Kitchen spironolactone (ALDACTONE)  25 MG tablet Take 25 mg by mouth daily.    No current facility-administered medications on file prior to visit.    Review of Systems  Constitutional: Negative for fever and chills.  HENT: Negative.   Respiratory: Negative for cough, chest tightness and wheezing.   Cardiovascular: Negative for chest pain and leg swelling.  Gastrointestinal: Negative for nausea, vomiting, abdominal pain, diarrhea and constipation.  Endocrine: Negative.  Negative for cold intolerance, heat intolerance, polydipsia, polyphagia and polyuria.  Genitourinary: Negative for dysuria and difficulty urinating.  Musculoskeletal: Negative.   Neurological: Negative for dizziness, light-headedness and numbness.  Psychiatric/Behavioral: Negative.    Per HPI unless specifically indicated above       Objective:    BP 102/66 mmHg  Pulse 80  Temp(Src) 98.1 F (36.7 C) (Oral)  Resp 16  Ht 5\' 3"  (1.6 m)  Wt 165 lb 6.4 oz (75.025 kg)  BMI 29.31 kg/m2  Wt Readings from Last 3 Encounters:  02/06/16 165 lb 6.4 oz (75.025 kg)  11/28/15 168 lb 4 oz (76.318 kg)  10/15/15 164 lb 6.4 oz (74.571 kg)    Physical Exam  Constitutional: She is oriented to person, place, and time. She appears well-developed and well-nourished.  HENT:  Head: Normocephalic and atraumatic.  Neck: Neck supple.  Cardiovascular: Normal rate, regular rhythm and normal heart sounds.  Exam reveals no gallop and no friction rub.   No murmur heard. Pulmonary/Chest: Effort normal and breath sounds normal. She has no wheezes. She exhibits no tenderness.  Abdominal: Soft. Normal appearance and bowel sounds are normal. She exhibits no distension and no mass. There is no tenderness. There is no rebound and no guarding.  Musculoskeletal: Normal range of motion. She exhibits no edema or tenderness.  Lymphadenopathy:    She has no cervical adenopathy.  Neurological: She is alert and oriented to person, place, and time.  Skin: Skin is warm and dry. She is not diaphoretic. No pallor.  Hair loss top of scalp.    Diabetic Foot Exam - Simple   Simple Foot Form  Diabetic Foot exam was performed with the following findings:  Yes 02/06/2016  9:36 AM  Visual Inspection  See comments:  Yes  Sensation Testing  Intact to touch and monofilament testing bilaterally:  Yes  Pulse Check  Posterior Tibialis and Dorsalis pulse intact bilaterally:  Yes  Comments  Missing nail on L great toe. Per patient report, this has been missing for many years.       Results for orders placed or performed in visit on 02/06/16  Hemoglobin A1c  Result Value Ref Range   Hemoglobin A1C 7.9   POCT UA - Microalbumin  Result Value Ref Range   Microalbumin Ur, POC 0 mg/L   Creatinine, POC 0 mg/dL   Albumin/Creatinine Ratio, Urine, POC 0        Assessment & Plan:   Problem List Items Addressed This Visit      Cardiovascular and Mediastinum   Essential hypertension   Relevant Medications   losartan-hydrochlorothiazide (HYZAAR) 100-12.5 MG tablet   Other Relevant Orders   Basic Metabolic Panel (BMET)     Respiratory   Allergic rhinitis   Relevant Medications   PROAIR HFA 108 (90 Base) MCG/ACT inhaler     Endocrine   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 NOVOFINE 30G X 8 MM MISC    Sig:   . FREESTYLE LITE test strip    Sig:   . PROAIR HFA 108 (90 Base) MCG/ACT inhaler    Sig: INHALE 2 PUFFS 4 TIMES DAILY AS NEEDED    Dispense:  1 Inhaler    Refill:  11    Order Specific Question:  Supervising Provider    Answer:  Janeann Forehand [phone]  . losartan-hydrochlorothiazide (HYZAAR) 100-12.5 MG tablet    Sig: Take 1 tablet by mouth daily.    Dispense:  90 tablet    Refill:  3    Order Specific Question:  Supervising Provider    Answer:  Janeann Forehand [[id number]]      Follow up plan: Return in about 6 months (around 08/08/2016) for HTN follow-up.

## 2016-02-06 NOTE — Patient Instructions (Signed)
Your goal blood pressure is 100/60 to 140/90. Work on low salt/sodium diet - goal <1.5gm (1,500mg ) per day. Eat a diet high in fruits/vegetables and whole grains.  Look into mediterranean and DASH diet. Goal activity is 19150min/wk of moderate intensity exercise.  This can be split into 30 minute chunks.  If you are not at this level, you can start with smaller 10-15 min increments and slowly build up activity. Look at www.heart.org for more resources  Please seek immediate medical attention at ER or Urgent Care if you develop: Chest pain, pressure or tightness. Shortness of breath accompanied by nausea or diaphoresis Visual changes Numbness or tingling on one side of the body Facial droop Altered mental status Or any concerning symptoms.

## 2016-02-25 ENCOUNTER — Other Ambulatory Visit: Payer: Self-pay | Admitting: Family Medicine

## 2016-02-25 ENCOUNTER — Other Ambulatory Visit

## 2016-02-25 DIAGNOSIS — I10 Essential (primary) hypertension: Secondary | ICD-10-CM

## 2016-02-25 LAB — BASIC METABOLIC PANEL
BUN: 18 mg/dL (ref 7–25)
CALCIUM: 9.7 mg/dL (ref 8.6–10.4)
CO2: 26 mmol/L (ref 20–31)
CREATININE: 0.98 mg/dL (ref 0.50–0.99)
Chloride: 102 mmol/L (ref 98–110)
GLUCOSE: 127 mg/dL — AB (ref 65–99)
Potassium: 4.4 mmol/L (ref 3.5–5.3)
Sodium: 139 mmol/L (ref 135–146)

## 2016-03-02 ENCOUNTER — Other Ambulatory Visit: Payer: Self-pay | Admitting: *Deleted

## 2016-03-02 DIAGNOSIS — J301 Allergic rhinitis due to pollen: Secondary | ICD-10-CM

## 2016-03-02 MED ORDER — PROAIR HFA 108 (90 BASE) MCG/ACT IN AERS: INHALATION_SPRAY | RESPIRATORY_TRACT | Status: DC

## 2016-03-09 ENCOUNTER — Ambulatory Visit (INDEPENDENT_AMBULATORY_CARE_PROVIDER_SITE_OTHER): Admitting: Family Medicine

## 2016-03-09 VITALS — BP 108/72 | HR 96 | Temp 98.3°F | Resp 16 | Ht 63.0 in | Wt 165.0 lb

## 2016-03-09 DIAGNOSIS — K629 Disease of anus and rectum, unspecified: Secondary | ICD-10-CM

## 2016-03-09 MED ORDER — ACYCLOVIR 5 % EX CREA: 1.0000 "application " | TOPICAL_CREAM | Freq: Four times a day (QID) | CUTANEOUS | Status: DC

## 2016-03-09 MED ORDER — TRIAMCINOLONE ACETONIDE 0.1 % EX CREA: 1.0000 "application " | TOPICAL_CREAM | Freq: Two times a day (BID) | CUTANEOUS | Status: DC

## 2016-03-09 NOTE — Patient Instructions (Signed)
We will culture the lesion to what it is exactly. Try the acyclovir cream to help with comfort. If the viral culture is negative- try applying triamcinolone cream twice daily.

## 2016-03-09 NOTE — Progress Notes (Signed)
Subjective:    Patient ID: Megan Mooney, female    DOB: 12-20-1955, 60 y.o.   MRN: [id number]  HPI: Megan Mooney is a 60 y.o. female presenting on 03/09/2016 for Vaginitis   HPI  Pt presents for perianal lesion. Has been popping about once per month for past 6 mos. Thought it was a hemorrhoid. Treated with preparation H- would go away. Painful. Puts neosporin. If water hits it- it hurts. Doesn;t hurt to urinate.  No history of herpes. Husband does have cold sores.   Past Medical History  Diagnosis Date  . Diabetes mellitus without complication (HCC)   . Hypertension   . Hyperlipidemia   . Depression   . GERD (gastroesophageal reflux disease)   . Allergy     seasonal, uses inhaler during this time  . Gastropathy     Current Outpatient Prescriptions on File Prior to Visit  Medication Sig  . ACCU-CHEK FASTCLIX LANCETS MISC   . aspirin 81 MG tablet Take 81 mg by mouth daily.  . cetirizine (ZYRTEC) 10 MG tablet TAKE 1 TABLET DAILY  . cyclobenzaprine (FLEXERIL) 10 MG tablet Take 1 tablet (10 mg total) by mouth at bedtime. (Patient taking differently: Take 10 mg by mouth as needed. )  . fluticasone (FLONASE) 50 MCG/ACT nasal spray Place 1 spray into both nostrils every morning. Reported on 10/01/2015  . FREESTYLE LITE test strip   . insulin aspart (NOVOLOG) 100 UNIT/ML FlexPen Inject 15 Units into the skin daily.  . Insulin Detemir (LEVEMIR) 100 UNIT/ML Pen Inject 80 Units into the skin daily.  Marland Kitchen losartan-hydrochlorothiazide (HYZAAR) 100-12.5 MG tablet Take 1 tablet by mouth daily.  . metFORMIN (GLUMETZA) 1000 MG (MOD) 24 hr tablet Take one tablet in the am with half tablet in the pm.  . naproxen (NAPROSYN) 500 MG tablet Take 1 tablet (500 mg total) by mouth 2 (two) times daily with a meal. (Patient taking differently: Take 500 mg by mouth as needed. )  . naproxen sodium (ANAPROX) 220 MG tablet Take 220 mg by mouth 2 (two) times daily with a meal.  . NOVOFINE 30G X 8 MM MISC   .  omeprazole (PRILOSEC) 20 MG capsule Take 1 capsule (20 mg total) by mouth daily.  . ondansetron (ZOFRAN-ODT) 8 MG disintegrating tablet Take 8 mg by mouth. Reported on 10/01/2015  . PROAIR HFA 108 (90 Base) MCG/ACT inhaler INHALE 2 PUFFS 4 TIMES DAILY AS NEEDED  . simvastatin (ZOCOR) 20 MG tablet Take 20 mg by mouth daily.  Marland Kitchen spironolactone (ALDACTONE) 25 MG tablet Take 25 mg by mouth daily.    No current facility-administered medications on file prior to visit.    Review of Systems  Constitutional: Negative for fever and chills.  HENT: Negative.   Respiratory: Negative for cough, chest tightness and wheezing.   Cardiovascular: Negative for chest pain and leg swelling.  Gastrointestinal: Negative for nausea, vomiting, abdominal pain, diarrhea and constipation.  Endocrine: Negative.  Negative for cold intolerance, heat intolerance, polydipsia, polyphagia and polyuria.  Genitourinary: Negative for dysuria, flank pain, difficulty urinating, genital sores and dyspareunia.  Musculoskeletal: Negative.   Skin: Positive for rash.  Neurological: Negative for dizziness, light-headedness and numbness.  Psychiatric/Behavioral: Negative.    Per HPI unless specifically indicated above     Objective:    BP 108/72 mmHg  Pulse 96  Temp(Src) 98.3 F (36.8 C) (Oral)  Resp 16  Ht  (1.6 m)  Wt 165 lb (74.844 kg)  BMI 29.24 kg/m2  Wt  Readings from Last 3 Encounters:  03/09/16 165 lb (74.844 kg)  02/06/16 165 lb 6.4 oz (75.025 kg)  11/28/15 168 lb 4 oz (76.318 kg)    Physical Exam  Genitourinary:    There is no lesion or injury on the right labia. There is no lesion or injury on the left labia.  Skin: Skin is warm and dry. Rash noted. Rash is vesicular. She is not diaphoretic. No pallor.   Results for orders placed or performed in visit on 02/25/16  Basic Metabolic Panel (BMET)  Result Value Ref Range   Sodium 139 135 - 146 mmol/L   Potassium 4.4 3.5 - 5.3 mmol/L   Chloride 102 98 -  110 mmol/L   CO2 26 20 - 31 mmol/L   Glucose, Bld 127 (H) 65 - 99 mg/dL   BUN 18 7 - 25 mg/dL   Creat 4.010.98 0.270.50 - 2.530.99 mg/dL   Calcium 9.7 8.6 - 66.410.4 mg/dL      Assessment & Plan:   Problem List Items Addressed This Visit    None    Visit Diagnoses    Perianal lesion    -  Primary    Herpetic lesion vs. dermatitis. Viral culture pending. Acycolvir cream. Consider viral suppression if positive for herpes.     Relevant Medications    acyclovir cream (ZOVIRAX) 5 %    triamcinolone cream (KENALOG) 0.1 %    Other Relevant Orders    Herpes simplex virus culture       Meds ordered this encounter  Medications  . acyclovir cream (ZOVIRAX) 5 %    Sig: Apply 1 application topically every 6 (six) hours.    Dispense:  15 g    Refill:  0    Order Specific Question:  Supervising Provider    Answer:  Janeann ForehandHAWKINS JR, JAMES H [[id number]]  . triamcinolone cream (KENALOG) 0.1 %    Sig: Apply 1 application topically 2 (two) times daily.    Dispense:  30 g    Refill:  0    Order Specific Question:  Supervising Provider    Answer:  Janeann ForehandHAWKINS JR, JAMES H [[id number]]      Follow up plan: Return if symptoms worsen or fail to improve.

## 2016-03-09 NOTE — Addendum Note (Signed)
Addended byLaroy Apple: KREBS, AMY L on: 03/09/2016 04:57 PM   Modules accepted: Orders

## 2016-03-11 LAB — HERPES SIMPLEX VIRUS CULTURE

## 2016-03-13 ENCOUNTER — Telehealth: Payer: Self-pay | Admitting: Family Medicine

## 2016-03-13 DIAGNOSIS — B009 Herpesviral infection, unspecified: Secondary | ICD-10-CM

## 2016-03-13 MED ORDER — VALACYCLOVIR HCL 500 MG PO TABS
500.0000 mg | ORAL_TABLET | Freq: Every day | ORAL | Status: DC
Start: 2016-03-13 — End: 2016-08-18

## 2016-03-13 NOTE — Telephone Encounter (Signed)
Reviewed viral culture results with patient. She was positive for herpes. We will start on suppressive therapy as discussed at her visit. Sending valtrex 500mg  once daily to her mail-order pharmamcy.

## 2016-03-17 ENCOUNTER — Other Ambulatory Visit: Payer: Self-pay | Admitting: Family Medicine

## 2016-03-24 ENCOUNTER — Other Ambulatory Visit: Payer: Self-pay | Admitting: Family Medicine

## 2016-05-04 ENCOUNTER — Telehealth: Payer: Self-pay | Admitting: Family Medicine

## 2016-05-04 ENCOUNTER — Encounter: Payer: Self-pay | Admitting: Family Medicine

## 2016-05-04 DIAGNOSIS — I10 Essential (primary) hypertension: Secondary | ICD-10-CM

## 2016-05-04 MED ORDER — LOSARTAN POTASSIUM-HCTZ 100-12.5 MG PO TABS: 1.0000 | ORAL_TABLET | Freq: Every day | ORAL | 3 refills | Status: DC

## 2016-05-04 NOTE — Telephone Encounter (Signed)
I did not receive any refill request for Losartan. Perhaps it was sent to the wrong provider office.  [facility] Have Reordered for [facility]. Pt aware.

## 2016-05-04 NOTE — Telephone Encounter (Signed)
Pt received notification from [facility] that said the provider did not approve refill of lorsartan.  Her call back number is [phone]

## 2016-05-12 ENCOUNTER — Ambulatory Visit (INDEPENDENT_AMBULATORY_CARE_PROVIDER_SITE_OTHER): Admitting: Podiatry

## 2016-05-12 ENCOUNTER — Encounter: Payer: Self-pay | Admitting: Podiatry

## 2016-05-12 VITALS — BP 114/76 | HR 104 | Ht 63.75 in | Wt 160.0 lb

## 2016-05-12 DIAGNOSIS — R262 Difficulty in walking, not elsewhere classified: Secondary | ICD-10-CM

## 2016-05-12 DIAGNOSIS — M216X9 Other acquired deformities of unspecified foot: Secondary | ICD-10-CM | POA: Diagnosis not present

## 2016-05-12 DIAGNOSIS — M7662 Achilles tendinitis, left leg: Principal | ICD-10-CM

## 2016-05-12 DIAGNOSIS — M7660 Achilles tendinitis, unspecified leg: Secondary | ICD-10-CM

## 2016-05-12 DIAGNOSIS — M216X1 Other acquired deformities of right foot: Secondary | ICD-10-CM

## 2016-05-12 DIAGNOSIS — M7661 Achilles tendinitis, right leg: Secondary | ICD-10-CM

## 2016-05-12 NOTE — Progress Notes (Signed)
  SUBJECTIVE: 60 y.o. year old female presents stating that she has had a plantar fasciitis on right foot and been very painful for the past few days. Unable to walk. Cannot take steop after sit for a while or getting out of bed.  Pain is from back of heel cord and goes down to heel. This has been going on for the past 6 months.  The initial episode was several years back and been off and on going problem. [facility] and got custom orthotics (Solo made) at [facility] 3 months ago along with exercise. The exercise has aggravated more.   REVIEW OF SYSTEMS: Medical record reviewed and noted of pertinent information.   OBJECTIVE: DERMATOLOGIC EXAMINATION: No abnormal skin lesions.   VASCULAR EXAMINATION OF LOWER LIMBS: Pedal pulses: All pedal pulses are palpable with normal pulsation.  Temperature gradient from tibial crest to dorsum of foot is within normal bilateral. No edema or erythema noted at the affected area of right heel.  NEUROLOGIC EXAMINATION OF THE LOWER LIMBS: All epicritic and tactile sensations grossly intact.   MUSCULOSKELETAL EXAMINATION: Positive for moderately tight Achilles tendon on right. Able to flex at the ankle joint beyond 90 degrees after some range of motion movement but initially stiff.  Pain at the back of Achilles tendon insertion site right foot.  ASSESSMENT: Achilles tendonitis right. Functional Ankle equinus right.   PLAN: Reviewed clinical findings and available treatment options. Night Splint dispensed. 1/4" Heel lift applied to both shoes under orthotics. May benefit from Flexor patch. Patient is to use 1/4 piece per day. Patient understand possible complication. Patient will watch out for possible side effect, N&V, possible difficulty breathing.  Return in 10 days.

## 2016-05-12 NOTE — Patient Instructions (Signed)
Seen for pain in right heel. Reviewed findings and available options. Added heel lift in existing orthotics. Night Splint dispensed x 1. Flector patch prescribed. Return in 10 days.

## 2016-05-13 ENCOUNTER — Other Ambulatory Visit: Payer: Self-pay | Admitting: Family Medicine

## 2016-05-13 DIAGNOSIS — K219 Gastro-esophageal reflux disease without esophagitis: Secondary | ICD-10-CM

## 2016-05-26 ENCOUNTER — Ambulatory Visit (INDEPENDENT_AMBULATORY_CARE_PROVIDER_SITE_OTHER): Admitting: Podiatry

## 2016-05-26 DIAGNOSIS — M7730 Calcaneal spur, unspecified foot: Secondary | ICD-10-CM | POA: Diagnosis not present

## 2016-05-26 DIAGNOSIS — M216X1 Other acquired deformities of right foot: Secondary | ICD-10-CM

## 2016-05-26 DIAGNOSIS — M7662 Achilles tendinitis, left leg: Secondary | ICD-10-CM | POA: Diagnosis not present

## 2016-05-26 DIAGNOSIS — R262 Difficulty in walking, not elsewhere classified: Secondary | ICD-10-CM

## 2016-05-26 DIAGNOSIS — M7661 Achilles tendinitis, right leg: Secondary | ICD-10-CM

## 2016-05-26 NOTE — Patient Instructions (Signed)
Right posterior heel pain. Improvement made by Night Splint and Flector patch. Still having residual pain. Will alternate with Lidoderm patch and use CAM walker at work. Need custom orthotics.

## 2016-05-26 NOTE — Progress Notes (Signed)
SUBJECTIVE: 60 y.o. year old female presents stating she is using Flector patch and Night Splint. Still having pain at right posterior heel.  Does part time work requiring on feet in concrete floor.   Past history include plantar fasciitis on right foot  Pain is from back of heel cord and goes down to heel. This has been going on for the past 6 months.  The initial episode was several years back and been off and on going problem. [facility] got [facility] orthotics (Solo made) at [facility] 3 months ago along with exercise. The exercise has aggravated more.   OBJECTIVE: DERMATOLOGIC EXAMINATION: No abnormal skin lesions.   VASCULAR EXAMINATION OF LOWER LIMBS: Pedal pulses: All pedal pulses are palpable with normal pulsation.  Temperature gradient from tibial crest to dorsum of foot is within normal bilateral. No edema or erythema noted at the affected area of right heel.  NEUROLOGIC EXAMINATION OF THE LOWER LIMBS: All epicritic and tactile sensations grossly intact.   MUSCULOSKELETAL EXAMINATION: Positive for moderately tight Achilles tendon on right. Able to flex at the ankle joint beyond 90 degrees after some range of motion movement but initially stiff.  Pain at the back of Achilles tendon insertion site right foot.  Radiographic examination reveal significant spurring at posterior and inferior calcaneal tuberosity on both feet.   ASSESSMENT: Achilles tendonitis right. Functional Ankle equinus right.   PLAN: Reviewed clinical findings and available treatment options. Continue with Night Splint.               1/4" Heel lift applied to both shoes under orthotics. May benefit from using Lidoderm patch to alternate with Flector patch.  Return for custom orthotics.

## 2016-05-27 ENCOUNTER — Encounter: Payer: Self-pay | Admitting: Podiatry

## 2016-06-16 ENCOUNTER — Other Ambulatory Visit: Payer: Self-pay | Admitting: Podiatry

## 2016-06-16 DIAGNOSIS — M7661 Achilles tendinitis, right leg: Secondary | ICD-10-CM

## 2016-06-25 ENCOUNTER — Ambulatory Visit

## 2016-06-29 ENCOUNTER — Ambulatory Visit
Admission: RE | Admit: 2016-06-29 | Discharge: 2016-06-29 | Disposition: A | Discharge status: Home / Self Care | Payer: No Typology Code available for payment source | Source: Ambulatory Visit | Attending: Podiatry | Admitting: Podiatry

## 2016-06-29 DIAGNOSIS — M7661 Achilles tendinitis, right leg: Secondary | ICD-10-CM | POA: Insufficient documentation

## 2016-07-28 ENCOUNTER — Encounter: Payer: Self-pay | Admitting: Family Medicine

## 2016-07-28 LAB — CBC WITH DIFFERENTIAL
HCT: 38.2
HEMOGLOBIN: 12.1 g/dL
MCH: 28.7
MCHC: 31.7
MCV: 90.7
MPV: 10.5 fL (ref 7.5–11.5)
RBC: 4.2
RDW: 14.1
WBC: 8.6

## 2016-07-28 LAB — HEPATIC FUNCTION PANEL
ALT: 17
AST: 14 U/L
Alkaline Phosphatase: 82 U/L
Total Bilirubin: 0.6 mg/dL

## 2016-07-28 LAB — VITAMIN D, 1,25 + 25-HYDROXY: Vit D, 1,25-Dihydroxy: 43.16 (ref 30–100)

## 2016-07-28 LAB — COMPREHENSIVE METABOLIC PANEL
BUN: 19
CO2: 27 mmol/L
CREATININE: 1.27
Chloride: 104 mmol/L
GLUCOSE: 123
Potassium: 4.6 mmol/L
Sodium: 141

## 2016-07-28 LAB — LIPID PANEL
CHOLESTEROL: 134
HDL: 56 (ref 35–85)
LDL CALC: 47
NON HDL CHOL. (LDL+ VLDL): 78
Triglycerides: 153

## 2016-07-28 LAB — HEMOGLOBIN A1C: Hemoglobin A1C: 7.5

## 2016-07-28 LAB — TSH: TSH: 1.91 (ref 0.34–5.60)

## 2016-07-29 ENCOUNTER — Other Ambulatory Visit: Payer: Self-pay | Admitting: Family Medicine

## 2016-07-29 DIAGNOSIS — Z1239 Encounter for other screening for malignant neoplasm of breast: Secondary | ICD-10-CM

## 2016-07-30 ENCOUNTER — Other Ambulatory Visit: Payer: Self-pay | Admitting: Family Medicine

## 2016-07-30 ENCOUNTER — Ambulatory Visit (INDEPENDENT_AMBULATORY_CARE_PROVIDER_SITE_OTHER): Admitting: Family Medicine

## 2016-07-30 ENCOUNTER — Ambulatory Visit: Admitting: Family Medicine

## 2016-07-30 ENCOUNTER — Encounter: Payer: Self-pay | Admitting: Family Medicine

## 2016-07-30 VITALS — BP 109/68 | HR 86 | Temp 98.8°F | Resp 16 | Ht 63.0 in | Wt 166.0 lb

## 2016-07-30 DIAGNOSIS — A6000 Herpesviral infection of urogenital system, unspecified: Secondary | ICD-10-CM | POA: Insufficient documentation

## 2016-07-30 DIAGNOSIS — Z794 Long term (current) use of insulin: Secondary | ICD-10-CM

## 2016-07-30 DIAGNOSIS — E113319 Type 2 diabetes mellitus with moderate nonproliferative diabetic retinopathy with macular edema, unspecified eye: Secondary | ICD-10-CM

## 2016-07-30 DIAGNOSIS — E663 Overweight: Secondary | ICD-10-CM

## 2016-07-30 DIAGNOSIS — E782 Mixed hyperlipidemia: Secondary | ICD-10-CM

## 2016-07-30 DIAGNOSIS — I10 Essential (primary) hypertension: Secondary | ICD-10-CM

## 2016-07-30 DIAGNOSIS — Z124 Encounter for screening for malignant neoplasm of cervix: Secondary | ICD-10-CM

## 2016-07-30 DIAGNOSIS — Z0000 Encounter for general adult medical examination without abnormal findings: Secondary | ICD-10-CM

## 2016-07-30 DIAGNOSIS — A6004 Herpesviral vulvovaginitis: Secondary | ICD-10-CM

## 2016-07-30 NOTE — Assessment & Plan Note (Addendum)
Stable, [facility] and [facility] retin[facility] Not due for repeat A1c UTD on foot exam, awaiting records from ophtho

## 2016-07-30 NOTE — Assessment & Plan Note (Signed)
Stable, at goal LDL 47 ([facility] 06/2016 Continue Zocor

## 2016-07-30 NOTE — Assessment & Plan Note (Signed)
Improved control with lesser outbreaks on valtrex 500 suppression, still about monthly, using PRN acyclovir topical Future consider inc valtrex to 1000mg  daily if worsening outbreaks

## 2016-07-30 NOTE — Assessment & Plan Note (Signed)
Controlled Continue current regimen [facility]

## 2016-07-30 NOTE — Assessment & Plan Note (Signed)
Stable to improved weight Continue lifestyle modifications

## 2016-07-30 NOTE — Progress Notes (Signed)
Subjective:    Patient ID: Megan Mooney, female    DOB: 1955/12/01, 59 y.o.   MRN: [id number]  Megan Mooney is a 60 y.o. female presenting on 07/30/2016 for Gynecologic Exam (09/15/2013 last pap negative)   HPI   HSV2 Genital Herpes - Recently confirmed on swab test with diagnosis 02/2016, treated with suppressive therapy since then with Valtrex 500mg  daily with good results. Had been getting outbreaks monthly over past several years, now improved. Also treated with topical Acyclovir cream at onset symptoms.  CHRONIC DM, Type 2: Reports she is followed by [facility] for managing her diabetes. No concerns today. Meds: long term insulin treatment, levemir 80u daily, Novolog wc, Metformin 24hr - 1000mg  daily Reports  good compliance. Tolerating well w/o side-effects Currently on ARB  - Following DM diet, regular exercise - [facility], was treated for DM retinopathy, prior injections, last visit 06/2016 Denies hypoglycemia, polyuria, visual changes, numbness or tingling.  CHRONIC HTN: Reports no concerns today Current Meds - Losartan-HCTZ, Spironolactone Reports good compliance, took meds today. Tolerating well, w/o complaints. Denies CP, dyspnea, HA, edema, dizziness / lightheadedness  HYPERLIPIDEMIA: - Reports no concerns. Last lipid panel 09/2015, controlled except abnormal TG - Currently taking Zocor 20mg  daily, tolerating well without side effects or myalgias - Weight stable  Health Maintenance: - Due for pap smear, last 08/2013, negative for malignancy and HPV negative, had one prior abnormal - Last colonoscopy 04/15/2012, negative result with 0 polyps, advised to return in 5 years (approx 2018), since her father has had multiple polyps in past without cancer - Last mammogram 08/26/2013, negative no prior problems, request order today for next mammo, denies any concerns today regarding lumps bumps or skin changes on breasts, declines clinical breast exam -  Received Flu shot 07/14/16 at [facility] - Received Zostavax shingles vaccine at pharmacy recently in 2017, awaiting record - Had routine HIV screening at [facility], awaiting record  Past Medical History:  Diagnosis Date  . Allergy    seasonal, uses inhaler during this time  . Depression   . Diabetes mellitus without complication (HCC)   . Gastropathy   . GERD (gastroesophageal reflux disease)   . Hyperlipidemia   . Hypertension    Social History   Social History  . Marital status: Married    Spouse name: [name]  . Number of children: [name]  . Years of education: [name]   Occupational History  . Not on file.   Social History Main Topics  . Smoking status: Former Smoker    Packs/day: 0.25    Years: 10.00    Types: Cigarettes    Quit date: 11/04/1986  . Smokeless tobacco: Never Used  . Alcohol use No  . Drug use: No  . Sexual activity: Not on file   Other Topics Concern  . Not on file   Social History Narrative  . No narrative on file   Family History  Problem Relation Age of Onset  . Hypertension Mother   . Hyperlipidemia Mother   . Hypertension Father   . Hyperlipidemia Father   . Hyperlipidemia Brother   . Hypertension Brother   . Colon cancer Neg Hx   . Esophageal cancer Neg Hx   . Stomach cancer Neg Hx    Current Outpatient Prescriptions on File Prior to Visit  Medication Sig  . ACCU-CHEK FASTCLIX LANCETS MISC   . acyclovir cream (ZOVIRAX) 5 % Apply 1 application topically every 6 (six) hours.  Marland Kitchen aspirin 81 MG  tablet Take 81 mg by mouth daily.  . cetirizine (ZYRTEC) 10 MG tablet TAKE 1 TABLET DAILY  . cyclobenzaprine (FLEXERIL) 10 MG tablet Take 1 tablet (10 mg total) by mouth at bedtime. (Patient taking differently: Take 10 mg by mouth as needed. )  . fluticasone (FLONASE) 50 MCG/ACT nasal spray Place 1 spray into both nostrils every morning. Reported on 10/01/2015  . FREESTYLE LITE test strip   . insulin aspart (NOVOLOG) 100 UNIT/ML FlexPen Inject 15 Units into  the skin daily.  . Insulin Detemir (LEVEMIR) 100 UNIT/ML Pen Inject 80 Units into the skin daily.  Marland Kitchen. losartan-hydrochlorothiazide (HYZAAR) 100-12.5 MG tablet Take 1 tablet by mouth daily.  . metFORMIN (GLUMETZA) 1000 MG (MOD) 24 hr tablet Take one tablet in the am with half tablet in the pm.  . naproxen (NAPROSYN) 500 MG tablet Take 1 tablet (500 mg total) by mouth 2 (two) times daily with a meal. (Patient taking differently: Take 500 mg by mouth as needed. )  . naproxen sodium (ANAPROX) 220 MG tablet Take 220 mg by mouth 2 (two) times daily with a meal.  . NOVOFINE 30G X 8 MM MISC   . omeprazole (PRILOSEC) 20 MG capsule TAKE 1 CAPSULE DAILY  . ondansetron (ZOFRAN-ODT) 8 MG disintegrating tablet Take 8 mg by mouth. Reported on 10/01/2015  . PROAIR HFA 108 (90 Base) MCG/ACT inhaler INHALE 2 PUFFS 4 TIMES DAILY AS NEEDED  . simvastatin (ZOCOR) 20 MG tablet Take 20 mg by mouth daily.  Marland Kitchen. triamcinolone cream (KENALOG) 0.1 % Apply 1 application topically 2 (two) times daily.  . valACYclovir (VALTREX) 500 MG tablet Take 1 tablet (500 mg total) by mouth daily.   No current facility-administered medications on file prior to visit.     Review of Systems  Constitutional: Negative for activity change, appetite change, chills, diaphoresis, fatigue and fever.  HENT: Negative for congestion and hearing loss.   Eyes: Negative for visual disturbance.  Respiratory: Negative for cough, chest tightness, shortness of breath and wheezing.   Cardiovascular: Negative for chest pain, palpitations and leg swelling.  Gastrointestinal: Negative for abdominal pain, constipation, diarrhea, nausea and vomiting.  Endocrine: Negative for cold intolerance, polydipsia and polyuria.  Genitourinary: Negative for dysuria, frequency and hematuria.  Musculoskeletal: Negative for arthralgias and neck pain.  Skin: Negative for rash.  Allergic/Immunologic: Negative for environmental allergies.  Neurological: Negative for  dizziness, weakness, light-headedness, numbness and headaches.  Hematological: Negative for adenopathy.  Psychiatric/Behavioral: Negative for behavioral problems, dysphoric mood and sleep disturbance.   Per HPI unless specifically indicated above     Objective:    BP 109/68   Pulse 86   Temp 98.8 F (37.1 C) (Oral)   Resp 16   Ht 5\' 3"  (1.6 m)   Wt 166 lb (75.3 kg)   BMI 29.41 kg/m   Wt Readings from Last 3 Encounters:  07/30/16 166 lb (75.3 kg)  05/12/16 160 lb (72.6 kg)  03/09/16 165 lb (74.8 kg)    Physical Exam  Constitutional: She is oriented to person, place, and time. She appears well-developed and well-nourished. No distress.  Well-appearing, comfortable, cooperative  HENT:  Head: Normocephalic and atraumatic.  Mouth/Throat: Oropharynx is clear and moist.  Eyes: Conjunctivae and EOM are normal. Pupils are equal, round, and reactive to light.  Neck: Normal range of motion. Neck supple. No thyromegaly present.  Cardiovascular: Normal rate, regular rhythm, normal heart sounds and intact distal pulses.   No murmur heard. Pulmonary/Chest: Effort normal and breath sounds  normal. No respiratory distress. She has no wheezes. She has no rales.  Abdominal: Soft. Bowel sounds are normal. She exhibits no distension and no mass. There is no tenderness.  Genitourinary:  Genitourinary Comments: Normal external female genitalia. Vaginal canal with mild vaginal atrophy, without lesions. Normal appearing cervix, without lesions or bleeding. Pap smear specimen collected. Bimanual exam without masses or cervical motion tenderness.  Pelvic Exam chaperoned by Elvina MattesNishaben Patel, CMA    Musculoskeletal: Normal range of motion. She exhibits no edema or tenderness.  Lymphadenopathy:    She has no cervical adenopathy.  Neurological: She is alert and oriented to person, place, and time.  Skin: Skin is warm and dry. No rash noted. She is not diaphoretic.  Psychiatric: She has a normal mood and  affect. Her behavior is normal.  Nursing note and vitals reviewed.  Results for orders placed or performed in visit on 03/09/16  Herpes simplex virus culture  Result Value Ref Range   HSV Culture/Type Comment (A)       Assessment & Plan:   Problem List Items Addressed This Visit    Overweight (BMI 25.0-29.9)    Stable to improved weight Continue lifestyle modifications      Hyperlipidemia    Stable, at goal LDL 47 (abstract [facility] from 06/2016 Continue Zocor      Relevant Medications   ezetimibe (ZETIA) 10 MG tablet   spironolactone (ALDACTONE) 25 MG tablet   Genital HSV    Improved control with lesser outbreaks on valtrex 500 suppression, still about monthly, using PRN acyclovir topical Future consider inc valtrex to 1000mg  daily if worsening outbreaks      Relevant Medications   acyclovir cream (ZOVIRAX) 5 %   Essential hypertension    Controlled Continue current regimen [facility] from 06/2016      Relevant Medications   ezetimibe (ZETIA) 10 MG tablet   spironolactone (ALDACTONE) 25 MG tablet   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  Saralyn PilarAlexander Cristen Bredeson, DO [facility] 07/30/2016, 5:15 PM

## 2016-07-30 NOTE — Patient Instructions (Signed)
Thank you for coming in to clinic today.  1. Pap smear collected today we will do HPV test as well. If double negative you are good for 3-5 years 2. Continue current plan, keep up the good work overall 3. For left hand numbness, most likely an early carpal tunnel, may try wrist splint or brace overnight to help reduce symptoms, can always try anti-inflammatory and or Tylenol as well. Let me know if this gets worse  Request opthalmology report with Diabetic retinopathy information [facility]  Request Zostavax shingles vaccine record from pharmacy  Request routine HIV screen from [facility]  Please schedule a follow-up appointment with Dr. Althea CharonKaramalegos in 1 year for Annual Physical or sooner as needed, we can do 6 month follow-up if you have other issues or concerns  If you have any other questions or concerns, please feel free to call the clinic or send a message through MyChart. You may also schedule an earlier appointment if necessary.  Megan PilarAlexander Jennea Rager, DO [facility]

## 2016-08-04 ENCOUNTER — Ambulatory Visit
Admission: RE | Admit: 2016-08-04 | Discharge: 2016-08-04 | Disposition: A | Discharge status: Home / Self Care | Source: Ambulatory Visit | Attending: Family Medicine | Admitting: Family Medicine

## 2016-08-04 DIAGNOSIS — Z1239 Encounter for other screening for malignant neoplasm of breast: Secondary | ICD-10-CM

## 2016-08-04 DIAGNOSIS — Z1231 Encounter for screening mammogram for malignant neoplasm of breast: Secondary | ICD-10-CM | POA: Diagnosis not present

## 2016-08-05 LAB — PAP, THIN PREP W/HPV RFLX HPV TYPE 16/18: HPV DNA High Risk: NOT DETECTED

## 2016-08-10 ENCOUNTER — Encounter: Payer: Self-pay | Admitting: *Deleted

## 2016-08-12 ENCOUNTER — Ambulatory Visit: Admitting: Family Medicine

## 2016-08-18 ENCOUNTER — Encounter: Payer: Self-pay | Admitting: Family Medicine

## 2016-08-18 DIAGNOSIS — B009 Herpesviral infection, unspecified: Secondary | ICD-10-CM

## 2016-08-18 DIAGNOSIS — K629 Disease of anus and rectum, unspecified: Secondary | ICD-10-CM

## 2016-08-18 MED ORDER — ACYCLOVIR 5 % EX CREA: 1.0000 "application " | TOPICAL_CREAM | Freq: Every day | CUTANEOUS | 2 refills | Status: DC

## 2016-08-18 MED ORDER — VALACYCLOVIR HCL 1 G PO TABS: 500.0000 mg | ORAL_TABLET | Freq: Every day | ORAL | 3 refills | Status: DC

## 2016-08-19 ENCOUNTER — Ambulatory Visit
Admission: RE | Admit: 2016-08-19 | Discharge: 2016-08-19 | Disposition: A | Discharge status: Home / Self Care | Payer: No Typology Code available for payment source | Source: Ambulatory Visit | Attending: Podiatry | Admitting: Podiatry

## 2016-08-19 ENCOUNTER — Encounter
Admission: RE | Disposition: A | Discharge status: Home / Self Care | Payer: Self-pay | Source: Ambulatory Visit | Attending: Podiatry

## 2016-08-19 ENCOUNTER — Ambulatory Visit: Payer: No Typology Code available for payment source | Admitting: Anesthesiology

## 2016-08-19 DIAGNOSIS — K219 Gastro-esophageal reflux disease without esophagitis: Secondary | ICD-10-CM | POA: Insufficient documentation

## 2016-08-19 DIAGNOSIS — Z794 Long term (current) use of insulin: Secondary | ICD-10-CM | POA: Insufficient documentation

## 2016-08-19 DIAGNOSIS — J45909 Unspecified asthma, uncomplicated: Secondary | ICD-10-CM | POA: Diagnosis not present

## 2016-08-19 DIAGNOSIS — Z87891 Personal history of nicotine dependence: Secondary | ICD-10-CM | POA: Diagnosis not present

## 2016-08-19 DIAGNOSIS — M722 Plantar fascial fibromatosis: Secondary | ICD-10-CM | POA: Insufficient documentation

## 2016-08-19 DIAGNOSIS — Z79899 Other long term (current) drug therapy: Secondary | ICD-10-CM | POA: Insufficient documentation

## 2016-08-19 DIAGNOSIS — M899 Disorder of bone, unspecified: Secondary | ICD-10-CM | POA: Diagnosis not present

## 2016-08-19 DIAGNOSIS — E119 Type 2 diabetes mellitus without complications: Secondary | ICD-10-CM | POA: Diagnosis not present

## 2016-08-19 DIAGNOSIS — M7660 Achilles tendinitis, unspecified leg: Secondary | ICD-10-CM | POA: Insufficient documentation

## 2016-08-19 DIAGNOSIS — I10 Essential (primary) hypertension: Secondary | ICD-10-CM | POA: Insufficient documentation

## 2016-08-19 HISTORY — PX: PLANTAR FASCIA RELEASE: SHX2239

## 2016-08-19 HISTORY — DX: Unspecified osteoarthritis, unspecified site: M19.90

## 2016-08-19 HISTORY — PX: ACHILLES TENDON SURGERY: SHX542

## 2016-08-19 HISTORY — PX: CALCANEAL OSTEOTOMY: SHX1281

## 2016-08-19 LAB — GLUCOSE, CAPILLARY
Glucose-Capillary: 132 mg/dL — ABNORMAL HIGH (ref 65–99)
Glucose-Capillary: 149 mg/dL — ABNORMAL HIGH (ref 65–99)

## 2016-08-19 SURGERY — FASCIOTOMY, PLANTAR, ENDOSCOPIC
Anesthesia: General | Laterality: Right | Wound class: Clean

## 2016-08-19 MED ORDER — OXYCODONE HCL 5 MG/5ML PO SOLN: 5.0000 mg | Freq: Once | ORAL | Status: DC | PRN

## 2016-08-19 MED ORDER — CEFAZOLIN SODIUM-DEXTROSE 2-4 GM/100ML-% IV SOLN
2.0000 g | Freq: Once | INTRAVENOUS | Status: AC
  Administered 2016-08-19: 2 g via INTRAVENOUS

## 2016-08-19 MED ORDER — OXYCODONE-ACETAMINOPHEN 5-325 MG PO TABS: 1.0000 | ORAL_TABLET | ORAL | 0 refills | Status: DC | PRN

## 2016-08-19 MED ORDER — FENTANYL CITRATE (PF) 250 MCG/5ML IJ SOLN
INTRAMUSCULAR | Status: DC | PRN
  Administered 2016-08-19: 50 ug via INTRAVENOUS

## 2016-08-19 MED ORDER — BUPIVACAINE-EPINEPHRINE (PF) 0.25% -1:200000 IJ SOLN
INTRAMUSCULAR | Status: DC | PRN
  Administered 2016-08-19: 20 mL

## 2016-08-19 MED ORDER — DEXAMETHASONE SODIUM PHOSPHATE 4 MG/ML IJ SOLN
INTRAMUSCULAR | Status: DC | PRN
  Administered 2016-08-19: 4 mg via INTRAVENOUS

## 2016-08-19 MED ORDER — ONDANSETRON HCL 4 MG/2ML IJ SOLN
INTRAMUSCULAR | Status: DC | PRN
  Administered 2016-08-19: 4 mg via INTRAVENOUS

## 2016-08-19 MED ORDER — MIDAZOLAM HCL 2 MG/2ML IJ SOLN
INTRAMUSCULAR | Status: DC | PRN
  Administered 2016-08-19 (×2): 2 mg via INTRAVENOUS

## 2016-08-19 MED ORDER — FENTANYL CITRATE (PF) 100 MCG/2ML IJ SOLN: 25.0000 ug | INTRAMUSCULAR | Status: DC | PRN

## 2016-08-19 MED ORDER — LACTATED RINGERS IV SOLN
INTRAVENOUS | Status: DC
  Administered 2016-08-19 (×2): via INTRAVENOUS

## 2016-08-19 MED ORDER — ONDANSETRON HCL 4 MG/2ML IJ SOLN: 4.0000 mg | Freq: Once | INTRAMUSCULAR | Status: DC | PRN

## 2016-08-19 MED ORDER — ROCURONIUM BROMIDE 100 MG/10ML IV SOLN
INTRAVENOUS | Status: DC | PRN
  Administered 2016-08-19: 25 mg via INTRAVENOUS

## 2016-08-19 MED ORDER — LIDOCAINE HCL (CARDIAC) 20 MG/ML IV SOLN
INTRAVENOUS | Status: DC | PRN
  Administered 2016-08-19: 40 mg via INTRAVENOUS

## 2016-08-19 MED ORDER — OXYCODONE HCL 5 MG PO TABS: 5.0000 mg | ORAL_TABLET | Freq: Once | ORAL | Status: DC | PRN

## 2016-08-19 MED ORDER — ROPIVACAINE HCL 5 MG/ML IJ SOLN
INTRAMUSCULAR | Status: DC | PRN
  Administered 2016-08-19: 30 mL via PERINEURAL

## 2016-08-19 MED ORDER — GLYCOPYRROLATE 0.2 MG/ML IJ SOLN
INTRAMUSCULAR | Status: DC | PRN
  Administered 2016-08-19: 0.1 mg via INTRAVENOUS

## 2016-08-19 MED ORDER — PROPOFOL 10 MG/ML IV BOLUS
INTRAVENOUS | Status: DC | PRN
  Administered 2016-08-19: 40 mg via INTRAVENOUS
  Administered 2016-08-19: 140 mg via INTRAVENOUS

## 2016-08-19 SURGICAL SUPPLY — 76 items
ANCHOR SUT 5.5 SPEEDSCREW (Screw) ×1 IMPLANT
APL SKNCLS STERI-STRIP NONHPOA (GAUZE/BANDAGES/DRESSINGS)
APPLICATOR COTTON TIP 6IN STRL (MISCELLANEOUS) ×6 IMPLANT
BANDAGE ELASTIC 4 LF NS (GAUZE/BANDAGES/DRESSINGS) ×4 IMPLANT
BENZOIN TINCTURE PRP APPL 2/3 (GAUZE/BANDAGES/DRESSINGS) IMPLANT
BLADE ENDOTRAC PUSH EPF/EGR (MISCELLANEOUS) IMPLANT
BLADE SURG 15 STRL LF DISP TIS (BLADE) ×2 IMPLANT
BLADE SURG 15 STRL SS (BLADE)
BLADE TRIANGLE EPF/EGR ENDO (BLADE) ×2 IMPLANT
BNDG CMPR 75X41 PLY HI ABS (GAUZE/BANDAGES/DRESSINGS) ×1
BNDG CMPR MED 5X4 ELC HKLP NS (GAUZE/BANDAGES/DRESSINGS) ×2
BNDG ESMARK 4X12 TAN STRL LF (GAUZE/BANDAGES/DRESSINGS) ×2 IMPLANT
BNDG GAUZE 4.5X4.1 6PLY STRL (MISCELLANEOUS) ×2 IMPLANT
BNDG STRETCH 4X75 STRL LF (GAUZE/BANDAGES/DRESSINGS) ×2 IMPLANT
CANISTER SUCT 1200ML W/VALVE (MISCELLANEOUS) ×2 IMPLANT
CUFF TOURN SGL QUICK 18 (TOURNIQUET CUFF) IMPLANT
CUFF TOURN SGL QUICK 24 (TOURNIQUET CUFF)
CUFF TRNQT CYL 24X4X40X1 (TOURNIQUET CUFF) IMPLANT
DURAPREP 26ML APPLICATOR (WOUND CARE) ×2 IMPLANT
GAUZE PETRO XEROFOAM 1X8 (MISCELLANEOUS) ×1 IMPLANT
GAUZE PETRO XEROFOAM 5X9 (MISCELLANEOUS) ×2 IMPLANT
GAUZE SPONGE 4X4 12PLY STRL (GAUZE/BANDAGES/DRESSINGS) ×2 IMPLANT
GLOVE BIO SURGEON STRL SZ7.5 (GLOVE) ×2 IMPLANT
GLOVE INDICATOR 8.0 STRL GRN (GLOVE) ×2 IMPLANT
GOWN STRL REUS W/ TWL LRG LVL3 (GOWN DISPOSABLE) ×2 IMPLANT
GOWN STRL REUS W/TWL LRG LVL3 (GOWN DISPOSABLE) ×4
IV NS 250ML (IV SOLUTION)
IV NS 250ML BAXH (IV SOLUTION) ×1 IMPLANT
K-WIRE DBL END TROCAR 6X.062 (WIRE) ×2
KIT ROOM TURNOVER OR (KITS) ×2 IMPLANT
KWIRE DBL END TROCAR 6X.062 (WIRE) IMPLANT
NDL 18GX1X1/2 (RX/OR ONLY) (NEEDLE) IMPLANT
NDL HYPO 25GX1X1/2 BEV (NEEDLE) IMPLANT
NDL MAYO CATGUT SZ5 (NEEDLE) ×2
NDL SUT 5 .5 CRC TPR PNT MAYO (NEEDLE) ×1 IMPLANT
NEEDLE 18GX1X1/2 (RX/OR ONLY) (NEEDLE) IMPLANT
NEEDLE HYPO 25GX1X1/2 BEV (NEEDLE) IMPLANT
NS IRRIG 500ML POUR BTL (IV SOLUTION) ×2 IMPLANT
PACK EXTREMITY ARMC (MISCELLANEOUS) ×2 IMPLANT
PAD CAST CTTN 4X4 STRL (SOFTGOODS) ×1 IMPLANT
PAD GROUND ADULT SPLIT (MISCELLANEOUS) ×2 IMPLANT
PADDING CAST COTTON 4X4 STRL (SOFTGOODS) ×4
RASP SM TEAR CROSS CUT (RASP) ×2 IMPLANT
SOL ANTI-FOG 6CC FOG-OUT (MISCELLANEOUS) IMPLANT
SOL FOG-OUT ANTI-FOG 6CC (MISCELLANEOUS)
SPLINT CAST 1 STEP 4X30 (MISCELLANEOUS) IMPLANT
SPLINT CAST 1 STEP 5X30 WHT (MISCELLANEOUS) ×2 IMPLANT
SPONGE LAP 18X18 5 PK (GAUZE/BANDAGES/DRESSINGS) ×1 IMPLANT
STOCKINETTE STRL 6IN 960660 (GAUZE/BANDAGES/DRESSINGS) ×2 IMPLANT
STRAP BODY AND KNEE 60X3 (MISCELLANEOUS) ×3 IMPLANT
STRIP CLOSURE SKIN 1/4X4 (GAUZE/BANDAGES/DRESSINGS) ×2 IMPLANT
SUT ETHILON 3 0 FSLX (SUTURE) ×1 IMPLANT
SUT ETHILON 4-0 (SUTURE)
SUT ETHILON 4-0 FS2 18XMFL BLK (SUTURE)
SUT ETHILON 5-0 FS-2 18 BLK (SUTURE) IMPLANT
SUT MNCRL 4-0 (SUTURE)
SUT MNCRL 4-0 27XMFL (SUTURE)
SUT MNCRL 5-0+ PC-1 (SUTURE) ×1 IMPLANT
SUT MNCRL+ 5-0 UNDYED PC-3 (SUTURE) IMPLANT
SUT MONOCRYL 5-0 (SUTURE) ×2
SUT ORTHOCORD OS-6 NDL 36 (SUTURE) ×1 IMPLANT
SUT VIC AB 0 CT1 36 (SUTURE) ×2 IMPLANT
SUT VIC AB 2-0 SH 27 (SUTURE) ×2
SUT VIC AB 2-0 SH 27XBRD (SUTURE) ×1 IMPLANT
SUT VIC AB 3-0 SH 27 (SUTURE) ×2
SUT VIC AB 3-0 SH 27X BRD (SUTURE) ×1 IMPLANT
SUT VIC AB 4-0 FS2 27 (SUTURE) ×3 IMPLANT
SUT VICRYL AB 3-0 FS1 BRD 27IN (SUTURE) ×1 IMPLANT
SUTURE ETHLN 4-0 FS2 18XMF BLK (SUTURE) IMPLANT
SUTURE MNCRL 4-0 27XMF (SUTURE) IMPLANT
SUTURE PDS 2-0 II CT-1 (SUTURE) ×1 IMPLANT
SYR 5ML LL (SYRINGE) IMPLANT
SYRINGE 10CC LL (SYRINGE) IMPLANT
WAND TENDON TOPAZ 0 ANGL (MISCELLANEOUS) IMPLANT
WAND TOPAZ MICRO DEBRIDER (MISCELLANEOUS) ×1 IMPLANT
WIRE MAGNUM (SUTURE) ×2 IMPLANT

## 2016-08-19 NOTE — Anesthesia Procedure Notes (Signed)
Anesthesia Regional Block:  Popliteal block  Pre-Anesthetic Checklist: ,, timeout performed, Correct Patient, Correct Site, Correct Laterality, Correct Procedure, Correct Position, site marked, Risks and benefits discussed,  Surgical consent,  Pre-op evaluation,  At surgeon's request and post-op pain management  Laterality: Right  Prep: chloraprep       Needles:  Injection technique: Single-shot  Needle Type: Echogenic Needle     Needle Length: 9cm 9 cm Needle Gauge: 21 and 21 G    Additional Needles:  Procedures: ultrasound guided (picture in chart) Popliteal block Narrative:  Start time: 08/19/2016 11:04 AM End time: 08/19/2016 11:12 AM Injection made incrementally with aspirations every 5 mL.  Performed by: Personally   Additional Notes: Functioning IV was confirmed and monitors applied. Ultrasound guidance: relevant anatomy identified, needle position confirmed, local anesthetic spread visualized around nerve(s)., vascular puncture avoided.  Image printed for medical record.  Negative aspiration and no paresthesias; incremental administration of local anesthetic. The patient tolerated the procedure well. Vitals signes recorded in RN notes.

## 2016-08-19 NOTE — Transfer of Care (Signed)
Immediate Anesthesia Transfer of Care Note  Patient: Megan Mooney  Procedure(s) Performed: Procedure(s) with comments: ENDOSCOPIC PLANTAR FASCIOTOMY (Right) - POPLITEAL ACHILLES TENDON REPAIR (Right) HAGLUNDS/RETROCALCANEAL OSTECTOMY (Right) - Diabetic - insulin and oral meds  Patient Location: PACU  Anesthesia Type: General ETT  Level of Consciousness: awake, alert  and patient cooperative  Airway and Oxygen Therapy: Patient Spontanous Breathing and Patient connected to supplemental oxygen  Post-op Assessment: Post-op Vital signs reviewed, Patient's Cardiovascular Status Stable, Respiratory Function Stable, Patent Airway and No signs of Nausea or vomiting  Post-op Vital Signs: Reviewed and stable  Complications: No apparent anesthesia complications

## 2016-08-19 NOTE — Discharge Instructions (Signed)
[facility]  POST OPERATIVE INSTRUCTIONS FOR DR. TROXLER AND DR[facility]   1. Take your medication as prescribed.  Pain medication should be taken only as needed.  2. Keep the dressing clean, dry and intact.  3. Keep your foot elevated above the heart level for the first 48 hours.  4. Walking to the bathroom and brief periods of walking are acceptable, unless we have instructed you to be non-weight bearing.  5. Always wear your post-op shoe when walking.  Always use your crutches if you are to be non-weight bearing.  6. Do not take a shower.   7. Every hour you are awake:  - Bend your knee 15 times.  8. Call [facility] [phone]) if any of the following problems occur: - You develop a temperature or fever. - The bandage becomes saturated with blood. - Medication does not stop your pain. - Injury of the foot occurs. - Any symptoms of infection including redness, odor, or red streaks running from wound.  General Anesthesia, Adult, Care After These instructions provide you with information about caring for yourself after your procedure. Your health care provider may also give you more specific instructions. Your treatment has been planned according to current medical practices, but problems sometimes occur. Call your health care provider if you have any problems or questions after your procedure. What can I expect after the procedure? After the procedure, it is common to have:  Vomiting.  A sore throat.  Mental slowness. It is common to feel:  Nauseous.  Cold or shivery.  Sleepy.  Tired.  Sore or achy, even in parts of your body where you did not have surgery. Follow these instructions at home: For at least 24 hours after the procedure:  Do not:  Participate in activities where you could fall or become injured.  Drive.  Use heavy machinery.  Drink alcohol.  Take sleeping  pills or medicines that cause drowsiness.  Make important decisions or sign legal documents.  Take care of children on your own.  Rest. Eating and drinking  If you vomit, drink water, juice, or soup when you can drink without vomiting.  Drink enough fluid to keep your urine clear or pale yellow.  Make sure you have little or no nausea before eating solid foods.  Follow the diet recommended by your health care provider. General instructions  Have a responsible adult stay with you until you are awake and alert.  Return to your normal activities as told by your health care provider. Ask your health care provider what activities are safe for you.  Take over-the-counter and prescription medicines only as told by your health care provider.  If you smoke, do not smoke without supervision.  Keep all follow-up visits as told by your health care provider. This is important. Contact a health care provider if:  You continue to have nausea or vomiting at home, and medicines are not helpful.  You cannot drink fluids or start eating again.  You cannot urinate after 8-12 hours.  You develop a skin rash.  You have fever.  You have increasing redness at the site of your procedure. Get help right away if:  You have difficulty breathing.  You have chest pain.  You have unexpected bleeding.  You feel that you are having a life-threatening or urgent problem. This information is not intended to replace advice given to you by your health care provider. Make sure you discuss  any questions you have with your health care provider. Document Released: 12/14/2000 Document Revised: 02/10/2016 Document Reviewed: 08/22/2015 Elsevier Interactive Patient Education  2017 [employer]

## 2016-08-19 NOTE — Op Note (Signed)
Operative note   Surgeon:Judd Mccubbin Armed forces logistics/support/administrative officerowler    Assistant: None    Preop diagnosis: 1. Right calcaneal exostosis 2. Right Achilles insertional tendinitis 3. Right heel plantar fasciitis    Postop diagnosis: Same    Procedure: 1. Right calcaneal exostectomy 2. Right Achilles insertional tendon repair 3. Endoscopic plantar fasciotomy right heel    EBL: Minimal    Anesthesia:regional and general    Hemostasis: Thigh tourniquet inflated to 250 mmHg for approximately 60 minutes    Specimen: Calcaneal bone spur    Complications: None    Operative indications:Megan Mooney is an 60 y.o. that presents today for surgical intervention.  The risks/benefits/alternatives/complications have been discussed and consent has been given.    Procedure:  Patient was brought into the OR and placed on the operating table in theprone position. After anesthesia was obtained theright lower extremity was prepped and draped in usual sterile fashion.  After inflation of the tourniquet attention was directed to the posterior aspect of the Achilles at its insertion. A longitudinal incision was then made. Sharp and blunt dissection carried down to the peritenon. This was incised and retracted medial and lateral. Medial and lateral dissection around the Achilles tendon was then performed. A midline splitting Achilles tendon incision was then made. The Achilles was then sharply removed off the posterior calcaneus. At this time there was noted be a large Achilles insertional exostosis. With the use of osteotome and rasp I was able to remove the spur and smoothed the posterior aspect of the Achilles insertional site down to good healthy bleeding bone. The superior aspect of the calcaneus was also smoothed to remove any of the posterior superior exostosis. Further evaluation did not reveal any further exostosis. The wound was flushed with copious amount of irrigation. At this time attention was then directed to the Achilles  insertional site where the Achilles insertional tonight is noted. This was transected and the Achilles was then infiltrated with the Topaz wand. The central midline splitting incision of the Achilles was then repaired with a 3-0 Vicryl. Next the Achilles tendon was then reattached to the posterior calcaneus. Was performed with a 5.5 mm speech screw by [employer].  Standard technique with use of a tap and then placement of the speech screw was performed. The Achilles was sutured with a short. The Achilles was suture with a #2 orthocord and tacked into the speech screw onto the calcaneus. Good alignment was noted. The wound was once again flushed with copious amount of irrigation. Layered closure was then performed with a 4-0 Vicryl for the peritenon and subcutaneous tissue and the skin was reapproximated with a 5-0 Monocryl.  Attention was then directed to the plantar fascial insertion where 2 small percutaneous edges were made on the medial and lateral aspect of the calcaneus at the plantar fascial insertion. Blunt dissection carried down to the plantar fascia. The plantar fascial elevator was then entered. The blunt trocar and cannula was inserted from medial to lateral. The small percutaneous stab incision was made laterally on the plantar fashion was noted and evaluated. Next the medial one half was then incised. Good Lisa the plantar fashion was visualized. The wound was flushed with copious amounts or irrigation. The true blunt trocar and cannula was removed. The incision sites were closed with a 3-0 nylon. Finally small percutaneous holes were placed with a 0.062 K wire into the plantar calcaneus. The Topaz wand was used to infiltrate the plantar fascia with multiple percutaneous holes. All areas were then infiltrated  with 0.025 Marcaine. Bulky sterile dressing was applied. She was then placed in a posterior splint with the foot held in gravity equinus.    Patient tolerated the procedure and anesthesia  well.  Was transported from the OR to the PACU with all vital signs stable and vascular status intact. To be discharged per routine protocol.  Will follow up in approximately 1 week in the outpatient clinic.

## 2016-08-19 NOTE — H&P (Signed)
HISTORY AND PHYSICAL INTERVAL NOTE:  08/19/2016  11:53 AM  Earleen ReaperJanet Kotlarz  has presented today for surgery, with the diagnosis of M76.61 ACHILLES TENDINITIS .  The various methods of treatment have been discussed with the patient.  No guarantees were given.  After consideration of risks, benefits and other options for treatment, the patient has consented to surgery.  I have reviewed the patients' chart and labs.    Patient Vitals for the past 24 hrs:  BP Temp Temp src Pulse Resp SpO2 Height Weight  08/19/16 1046 107/69 97.1 F (36.2 C) Tympanic 74 18 100 % 5\' 4"  (1.626 m) 74.4 kg (164 lb)    A history and physical examination was performed in my office.  The patient was reexamined.  There have been no changes to this history and physical examination.  Gwyneth RevelsFowler, Genee Rann A

## 2016-08-19 NOTE — Anesthesia Procedure Notes (Signed)
Procedure Name: Intubation Date/Time: 08/19/2016 12:10 PM Performed by: Jimmy PicketAMYOT, Oddie Kuhlmann Pre-anesthesia Checklist: Patient identified, Emergency Drugs available, Suction available, Patient being monitored and Timeout performed Patient Re-evaluated:Patient Re-evaluated prior to inductionOxygen Delivery Method: Circle system utilized Preoxygenation: Pre-oxygenation with 100% oxygen Intubation Type: IV induction Ventilation: Mask ventilation without difficulty Laryngoscope Size: Miller and 2 Grade View: Grade I Tube type: Oral Tube size: 7.0 mm Number of attempts: 1 Placement Confirmation: ETT inserted through vocal cords under direct vision,  positive ETCO2 and breath sounds checked- equal and bilateral Secured at: 22 cm Tube secured with: Tape Dental Injury: Teeth and Oropharynx as per pre-operative assessment

## 2016-08-19 NOTE — Progress Notes (Signed)
[facility] with right, ultrasound guided, popliteal block. Side rails up, monitors on throughout procedure. See vital signs in flow sheet. Tolerated Procedure well.

## 2016-08-19 NOTE — Anesthesia Preprocedure Evaluation (Signed)
Anesthesia Evaluation  Patient identified by MRN, date of birth, ID band Patient awake    Reviewed: Allergy & Precautions, H&P , NPO status , Patient's Chart, lab work & pertinent test results, reviewed documented beta blocker date and time   Airway Mallampati: II  TM Distance: >3 FB Neck ROM: full    Dental no notable dental hx.    Pulmonary asthma , former smoker,    Pulmonary exam normal breath sounds clear to auscultation       Cardiovascular Exercise Tolerance: Good hypertension,  Rhythm:regular Rate:Normal     Neuro/Psych negative neurological ROS  negative psych ROS   GI/Hepatic Neg liver ROS, GERD  ,  Endo/Other  diabetes, Insulin Dependent  Renal/GU negative Renal ROS  negative genitourinary   Musculoskeletal  (+) Arthritis ,   Abdominal   Peds  Hematology negative hematology ROS (+)   Anesthesia Other Findings   Reproductive/Obstetrics negative OB ROS                             Anesthesia Physical Anesthesia Plan  ASA: III  Anesthesia Plan: General ETT   Post-op Pain Management: GA combined w/ Regional for post-op pain   Induction:   Airway Management Planned:   Additional Equipment:   Intra-op Plan:   Post-operative Plan:   Informed Consent: I have reviewed the patients History and Physical, chart, labs and discussed the procedure including the risks, benefits and alternatives for the proposed anesthesia with the patient or authorized representative who has indicated his/her understanding and acceptance.   Dental Advisory Given  Plan Discussed with: CRNA  Anesthesia Plan Comments:         Anesthesia Quick Evaluation

## 2016-08-19 NOTE — Anesthesia Postprocedure Evaluation (Signed)
Anesthesia Post Note  Patient: Earleen ReaperJanet Piechocki  Procedure(s) Performed: Procedure(s) (LRB): ENDOSCOPIC PLANTAR FASCIOTOMY (Right) ACHILLES TENDON REPAIR (Right) HAGLUNDS/RETROCALCANEAL OSTECTOMY (Right)  Patient location during evaluation: PACU Anesthesia Type: General and Regional Level of consciousness: awake and alert Pain management: pain level controlled Vital Signs Assessment: post-procedure vital signs reviewed and stable Respiratory status: spontaneous breathing, nonlabored ventilation, respiratory function stable and patient connected to nasal cannula oxygen Cardiovascular status: blood pressure returned to baseline and stable Postop Assessment: no signs of nausea or vomiting Anesthetic complications: no    Scarlette Sliceachel B Beach

## 2016-08-20 ENCOUNTER — Encounter: Payer: Self-pay | Admitting: Podiatry

## 2016-08-21 LAB — SURGICAL PATHOLOGY

## 2016-08-25 ENCOUNTER — Encounter: Payer: Self-pay | Admitting: Podiatry

## 2016-10-19 ENCOUNTER — Other Ambulatory Visit: Payer: Self-pay | Admitting: *Deleted

## 2016-10-19 ENCOUNTER — Encounter: Payer: Self-pay | Admitting: Family Medicine

## 2016-10-19 MED ORDER — FLUTICASONE PROPIONATE 50 MCG/ACT NA SUSP: 1.0000 | Freq: Every morning | NASAL | 2 refills | Status: DC

## 2016-10-26 ENCOUNTER — Encounter: Payer: Self-pay | Admitting: Family Medicine

## 2016-10-26 ENCOUNTER — Ambulatory Visit (INDEPENDENT_AMBULATORY_CARE_PROVIDER_SITE_OTHER): Admitting: Family Medicine

## 2016-10-26 VITALS — BP 122/79 | HR 84 | Temp 98.5°F | Resp 16 | Ht 63.0 in | Wt 163.0 lb

## 2016-10-26 DIAGNOSIS — J209 Acute bronchitis, unspecified: Secondary | ICD-10-CM

## 2016-10-26 MED ORDER — IPRATROPIUM BROMIDE 0.06 % NA SOLN: 2.0000 | Freq: Four times a day (QID) | NASAL | 0 refills | Status: DC

## 2016-10-26 MED ORDER — AZITHROMYCIN 250 MG PO TABS: ORAL_TABLET | ORAL | 0 refills | Status: DC

## 2016-10-26 MED ORDER — BENZONATATE 100 MG PO CAPS: 100.0000 mg | ORAL_CAPSULE | Freq: Two times a day (BID) | ORAL | 0 refills | Status: DC | PRN

## 2016-10-26 NOTE — Patient Instructions (Signed)
Thank you for coming in to clinic today.  1. It sounds like you had an Upper Respiratory Virus that has settled into a Bronchitis, lower respiratory tract infection. I don't have concerns for pneumonia today, and think that this should gradually improve. Once you are feeling better, the cough may take a few weeks to fully resolve - Start Azithromycin Z pak (antibiotic) 2 tabs day 1, then 1 tab x 4 days, complete entire course even if improved - HOLD FLONASE for 1 week then resume. Start Atrovent nasal spray 2 sprays in each nostril up to max 3-4 times a day up to 3-4 days for nasal congestion, if you use for longer than this it may no longer be effective, stop using at 1 week - Start Tessalon Perls 1 up to 3 times a day as needed for cough - Can stop Mucinex if not helping - Continue ZYrtec 10mg  daily and (after 1 week then continue Flonase 2 sprays in each nostril daily for next 4-6 weeks, then you may stop and use seasonally or as needed - Drink plenty of fluids to improve congestion  If your symptoms seem to worsen instead of improve over next several days, including significant fever / chills, worsening shortness of breath, worsening wheezing, or nausea / vomiting and can't take medicines - return sooner or go to hospital Emergency Department for more immediate treatment.  Please schedule a follow-up appointment with Dr. Althea CharonKaramalegos in 2 weeks as needed for bronchitis  If you have any other questions or concerns, please feel free to call the clinic or send a message through MyChart. You may also schedule an earlier appointment if necessary.  Saralyn PilarAlexander Ryli Standlee, DO [facility]

## 2016-10-26 NOTE — Progress Notes (Signed)
Subjective:    Patient ID: Megan Mooney, female    DOB: 1955-09-27, 61 y.o.   MRN: [id number]  Megan Mooney is a 61 y.o. female presenting on 10/26/2016 for Cough (onset week getting worst nasal drainage but no fever)  Patient presents for a same day appointment.  HPI  URI / BRONCHITIS: Reports symptoms started about 1 week ago with sore scratchy throat with irritation, gradually worsening cough and secondary congestion with some nasal congestion and also deeper chest congestion, however without thick productive cough. - Tried OTC cough drops and NyQuil, only mild relief, Mucinex DM without relief - No known sick contacts. Received flu shot in 06/2016 - No recent illness or antibiotic courses over past >1 year - Denies any fevers/chills, nausea, vomiting, abdominal pain, diarrhea, myalgias, sore throat, ear pain pressure, sinus pain or pressure  Social History  Substance Use Topics  . Smoking status: Former Smoker    Packs/day: 0.25    Years: 10.00    Types: Cigarettes    Quit date: 11/04/1986  . Smokeless tobacco: Never Used  . Alcohol use No     Comment: 1 glass wine/month    Review of Systems Per HPI unless specifically indicated above     Objective:    BP 122/79   Pulse 84   Temp 98.5 F (36.9 C) (Oral)   Resp 16   Ht 5\' 3"  (1.6 m)   Wt 163 lb (73.9 kg)   SpO2 100%   BMI 28.87 kg/m   Wt Readings from Last 3 Encounters:  10/26/16 163 lb (73.9 kg)  08/19/16 164 lb (74.4 kg)  07/30/16 166 lb (75.3 kg)    Physical Exam  Constitutional: She appears well-developed and well-nourished. No distress.  Well-appearing, comfortable, cooperative  HENT:  Head: Normocephalic and atraumatic.  Mouth/Throat: Oropharynx is clear and moist.  Mild maxillary sinus tenderness, non tender Frontal. Nares patent with mild congestion without purulence or edema. Bilateral TMs clear without erythema, effusion or bulging. Oropharynx clear without erythema, exudates, edema or asymmetry.    Eyes: Conjunctivae are normal. Right eye exhibits no discharge. Left eye exhibits no discharge.  Neck: Normal range of motion. Neck supple.  Cardiovascular: Normal rate, regular rhythm, normal heart sounds and intact distal pulses.   No murmur heard. Pulmonary/Chest: Effort normal and breath sounds normal. No respiratory distress. She has no wheezes. She has no rales.  Occasional coughing. Speaks full sentences. Good air movement.  Musculoskeletal: She exhibits no edema.  Lymphadenopathy:    She has no cervical adenopathy.  Neurological: She is alert.  Skin: Skin is warm and dry. No rash noted. She is not diaphoretic.  Psychiatric: Her behavior is normal.  Nursing note and vitals reviewed.      Assessment & Plan:   Problem List Items Addressed This Visit    None    Visit Diagnoses    Acute bronchitis, unspecified organism    -  Primary  Consistent with worsening bronchitis in setting of likely viral URI also likely allergic rhinitis component. Concern with duration >1 week - Afebrile, no focal signs of infection (not consistent with pneumonia by history or exam), no evidence sinusitis. Lungs clear without concern for bronchospasm, never smoker  Plan: 1. Discussion on antibiotics, and advised may be self limited, however mutual decision agree patient to start Azithromycin Z-pak dosing 500mg  then 250mg  daily x 4 days, advised she could wait 24-48 hours to see if improving first but patient opts to take it now 2. Rx  Tessalon Perls for cough PRN 3. Start Atrovent nasal spray decongestant 2 sprays in each nostril up to 4 times daily for 7 days, hold flonase for 1 week then resume after atrovent 4. Continue Zyrtec, and Flonase 2 sprays in each nostril daily for next 4-6 weeks, then may stop and use seasonally or as needed 5. Return criteria reviewed, follow-up within 1 week if not improved     Relevant Medications   benzonatate (TESSALON) 100 MG capsule   ipratropium (ATROVENT) 0.06  % nasal spray   azithromycin (ZITHROMAX Z-PAK) 250 MG tablet      Meds ordered this encounter  Medications  . benzonatate (TESSALON) 100 MG capsule    Sig: Take 1 capsule (100 mg total) by mouth 2 (two) times daily as needed for cough.    Dispense:  20 capsule    Refill:  0  . ipratropium (ATROVENT) 0.06 % nasal spray    Sig: Place 2 sprays into both nostrils 4 (four) times daily. For up to 5-7 days then stop.    Dispense:  15 mL    Refill:  0  . azithromycin (ZITHROMAX Z-PAK) 250 MG tablet    Sig: Take 2 tabs (500mg  total) on Day 1. Take 1 tab (250mg ) daily for next 4 days.    Dispense:  6 tablet    Refill:  0      Follow up plan: Return in about 2 weeks (around 11/09/2016), or if symptoms worsen or fail to improve, for bronchitis.  Saralyn PilarAlexander Karamalegos, DO [facility] 10/26/2016, 1:19 PM

## 2017-02-07 ENCOUNTER — Encounter: Payer: Self-pay | Admitting: Family Medicine

## 2017-02-08 ENCOUNTER — Other Ambulatory Visit: Payer: Self-pay

## 2017-02-08 MED ORDER — CETIRIZINE HCL 10 MG PO TABS: 10.0000 mg | ORAL_TABLET | Freq: Every day | ORAL | 0 refills | Status: DC

## 2017-03-08 ENCOUNTER — Encounter: Payer: Self-pay | Admitting: Family Medicine

## 2017-03-08 DIAGNOSIS — B009 Herpesviral infection, unspecified: Secondary | ICD-10-CM

## 2017-03-09 MED ORDER — VALACYCLOVIR HCL 500 MG PO TABS: 500.0000 mg | ORAL_TABLET | Freq: Every day | ORAL | 11 refills | Status: DC

## 2017-03-16 ENCOUNTER — Other Ambulatory Visit: Payer: Self-pay

## 2017-03-16 DIAGNOSIS — B009 Herpesviral infection, unspecified: Secondary | ICD-10-CM

## 2017-03-16 MED ORDER — CETIRIZINE HCL 10 MG PO TABS: 10.0000 mg | ORAL_TABLET | Freq: Every day | ORAL | 1 refills | Status: AC

## 2017-03-16 MED ORDER — VALACYCLOVIR HCL 500 MG PO TABS: 500.0000 mg | ORAL_TABLET | Freq: Every day | ORAL | 3 refills | Status: DC

## 2017-05-02 ENCOUNTER — Encounter: Payer: Self-pay | Admitting: Family Medicine

## 2017-05-03 ENCOUNTER — Other Ambulatory Visit: Payer: Self-pay

## 2017-05-03 DIAGNOSIS — I10 Essential (primary) hypertension: Secondary | ICD-10-CM

## 2017-05-03 MED ORDER — LOSARTAN POTASSIUM-HCTZ 100-12.5 MG PO TABS: 1.0000 | ORAL_TABLET | Freq: Every day | ORAL | 0 refills | Status: DC

## 2017-05-04 ENCOUNTER — Other Ambulatory Visit: Payer: Self-pay

## 2017-05-11 ENCOUNTER — Other Ambulatory Visit: Payer: Self-pay | Admitting: Family

## 2017-05-11 DIAGNOSIS — Z1239 Encounter for other screening for malignant neoplasm of breast: Secondary | ICD-10-CM

## 2017-07-22 ENCOUNTER — Ambulatory Visit
Admission: RE | Admit: 2017-07-22 | Discharge: 2017-07-22 | Disposition: A | Discharge status: Home / Self Care | Source: Ambulatory Visit | Attending: Podiatry | Admitting: Podiatry

## 2017-07-22 ENCOUNTER — Other Ambulatory Visit: Payer: Self-pay | Admitting: Podiatry

## 2017-07-22 DIAGNOSIS — M7989 Other specified soft tissue disorders: Secondary | ICD-10-CM | POA: Insufficient documentation

## 2017-07-22 DIAGNOSIS — M79604 Pain in right leg: Secondary | ICD-10-CM

## 2017-07-30 ENCOUNTER — Encounter: Admitting: Family Medicine

## 2017-08-25 ENCOUNTER — Ambulatory Visit
Admission: RE | Admit: 2017-08-25 | Discharge: 2017-08-25 | Disposition: A | Discharge status: Home / Self Care | Source: Ambulatory Visit | Attending: Family | Admitting: Family

## 2017-08-25 DIAGNOSIS — Z1231 Encounter for screening mammogram for malignant neoplasm of breast: Secondary | ICD-10-CM | POA: Insufficient documentation

## 2017-08-25 DIAGNOSIS — Z1239 Encounter for other screening for malignant neoplasm of breast: Secondary | ICD-10-CM

## 2017-10-13 ENCOUNTER — Other Ambulatory Visit: Payer: Self-pay | Admitting: Family Medicine

## 2017-10-13 ENCOUNTER — Ambulatory Visit (INDEPENDENT_AMBULATORY_CARE_PROVIDER_SITE_OTHER): Admitting: Obstetrics and Gynecology

## 2017-10-13 ENCOUNTER — Encounter: Payer: Self-pay | Admitting: Obstetrics and Gynecology

## 2017-10-13 VITALS — BP 90/59 | HR 94 | Ht 63.0 in | Wt 161.6 lb

## 2017-10-13 DIAGNOSIS — N952 Postmenopausal atrophic vaginitis: Secondary | ICD-10-CM

## 2017-10-13 DIAGNOSIS — Z8619 Personal history of other infectious and parasitic diseases: Secondary | ICD-10-CM | POA: Diagnosis not present

## 2017-10-13 DIAGNOSIS — N951 Menopausal and female climacteric states: Secondary | ICD-10-CM | POA: Diagnosis not present

## 2017-10-13 DIAGNOSIS — Z7689 Persons encountering health services in other specified circumstances: Secondary | ICD-10-CM | POA: Diagnosis not present

## 2017-10-13 MED ORDER — ESTRADIOL 1 MG PO TABS: 1.0000 mg | ORAL_TABLET | Freq: Every day | ORAL | 11 refills | Status: DC

## 2017-10-13 MED ORDER — ESTROGENS, CONJUGATED 0.625 MG/GM VA CREA: 1.0000 | TOPICAL_CREAM | Freq: Every day | VAGINAL | 1 refills | Status: DC

## 2017-10-13 NOTE — Patient Instructions (Signed)
Atrophic Vaginitis Atrophic vaginitis is a condition in which the tissues that line the vagina become dry and thin. This condition is most common in women who have stopped having regular menstrual periods (menopause). This usually starts when a woman is 45-64 years old. Estrogen helps to keep the vagina moist. It stimulates the vagina to produce a clear fluid that lubricates the vagina for sexual intercourse. This fluid also protects the vagina from infection. Lack of estrogen can cause the lining of the vagina to get thinner and dryer. The vagina may also shrink in size. It may become less elastic. Atrophic vaginitis tends to get worse over time as a woman's estrogen level drops. What are the causes? This condition is caused by the normal drop in estrogen that happens around the time of menopause. What increases the risk? Certain conditions or situations may lower a woman's estrogen level, which increases her risk of atrophic vaginitis. These include:  Taking medicine that blocks estrogen.  Having ovaries removed surgically.  Being treated for cancer with X-ray treatment (radiation) or medicines (chemotherapy).  Exercising very hard and often.  Having an eating disorder (anorexia).  Giving birth or breastfeeding.  Being over the age of 50.  Smoking.  What are the signs or symptoms? Symptoms of this condition include:  Pain, soreness, or bleeding during sexual intercourse (dyspareunia).  Vaginal burning, irritation, or itching.  Pain or bleeding during a vaginal examination using a speculum (pelvic exam).  Loss of interest in sexual activity.  Having burning pain when passing urine.  Vaginal discharge that is brown or yellow.  In some cases, there are no symptoms. How is this diagnosed? This condition is diagnosed with a medical history and physical exam. This will include a pelvic exam that checks whether the inside of your vagina appears pale, thin, or dry. Rarely, you may  also have other tests, including:  A urine test.  A test that checks the acid balance in your vaginal fluid (acid balance test).  How is this treated? Treatment for this condition may depend on the severity of your symptoms. Treatment may include:  Using an over-the-counter vaginal lubricant before you have sexual intercourse.  Using a long-acting vaginal moisturizer.  Using low-dose vaginal estrogen for moderate to severe symptoms that do not respond to other treatments. Options include creams, tablets, and inserts (vaginal rings). Before using vaginal estrogen, tell your health care provider if you have a history of: ? Breast cancer. ? Endometrial cancer. ? Blood clots.  Taking medicines. You may be able to take a daily pill for dyspareunia. Discuss all of the risks of this medicine with your health care provider. It is usually not recommended for women who have a family history or personal history of breast cancer.  If your symptoms are very mild and you are not sexually active, you may not need treatment. Follow these instructions at home:  Take medicines only as directed by your health care provider. Do not use herbal or alternative medicines unless your health care provider says that you can.  Use over-the-counter creams, lubricants, or moisturizers for dryness only as directed by your health care provider.  If your atrophic vaginitis is caused by menopause, discuss all of your menopausal symptoms and treatment options with your health care provider.  Do not douche.  Do not use products that can make your vagina dry. These include: ? Scented feminine sprays. ? Scented tampons. ? Scented soaps.  If it hurts to have sex, talk with your sexual   partner. Contact a health care provider if:  Your discharge looks different than normal.  Your vagina has an unusual smell.  You have new symptoms.  Your symptoms do not improve with treatment.  Your symptoms get worse. This  information is not intended to replace advice given to you by your health care provider. Make sure you discuss any questions you have with your health care provider. Document Released: 01/22/2015 Document Revised: 02/13/2016 Document Reviewed: 08/29/2014 Elsevier Interactive Patient Education  2018 [employer]  Menopause and Hormone Replacement Therapy What is hormone replacement therapy? Hormone replacement therapy (HRT) is the use of artificial (synthetic) hormones to replace hormones that your body stops producing during menopause. Menopause is the normal time of life when menstrual periods stop completely and the ovaries stop producing the female hormones estrogen and progesterone. This lack of hormones can affect your health and cause undesirable symptoms. HRT can relieve some of those symptoms. What are my options for HRT? HRT may consist of the synthetic hormones estrogen and progestin, or it may consist of only estrogen (estrogen-only therapy). You and your health care provider will decide which form of HRT is best for you. If you choose to be on HRT and you have a uterus, estrogen and progestin are usually prescribed. Estrogen-only therapy is used for women who do not have a uterus. Possible options for taking HRT include:  Pills.  Patches.  Gels.  Sprays.  Vaginal cream.  Vaginal rings.  Vaginal inserts.  The amount of hormone(s) that you take and how long you take the hormone(s) varies depending on your individual health. It is important to:  Begin HRT with the lowest possible dosage.  Stop HRT as soon as your health care provider tells you to stop.  Work with your health care provider so that you feel informed and comfortable with your decisions.  What are the benefits of HRT? HRT can reduce the frequency and severity of menopausal symptoms. Benefits of HRT vary depending on the menopausal symptoms that you have, the severity of your symptoms, and your overall  health. HRT may help to improve the following menopausal symptoms:  Hot flashes and night sweats. These are sudden feelings of heat that spread over the face and body. The skin may turn red, like a blush. Night sweats are hot flashes that happen while you are sleeping or trying to sleep.  Bone loss (osteoporosis). The body loses calcium more quickly after menopause, causing the bones to become weaker. This can increase the risk for bone breaks (fractures).  Vaginal dryness. The lining of the vagina can become thin and dry, which can cause pain during sexual intercourse or cause infection, burning, or itching.  Urinary tract infections.  Urinary incontinence. This is a decreased ability to control when you urinate.  Irritability.  Short-term memory problems.  What are the risks of HRT? Risks of HRT vary depending on your individual health and medical history. Risks of HRT also depend on whether you receive both estrogen and progestin or you receive estrogen only.HRT may increase the risk of:  Spotting. This is when a small amount of bloodleaks from the vagina unexpectedly.  Endometrial cancer. This cancer is in the lining of the uterus (endometrium).  Breast cancer.  Increased density of breast tissue. This can make it harder to find breast cancer on a breast X-ray (mammogram).  Stroke.  Heart attack.  Blood clots.  Gallbladder disease.  Risks of HRT can increase if you have any of the following  conditions:  Endometrial cancer.  Liver disease.  Heart disease.  Breast cancer.  History of blood clots.  History of stroke.  How should I care for myself while I am on HRT?  Take over-the-counter and prescription medicines only as told by your health care provider.  Get mammograms, pelvic exams, and medical checkups as often as told by your health care provider.  Have Pap tests done as often as told by your health care provider. A Pap test is sometimes called a Pap  smear. It is a screening test that is used to check for signs of cancer of the cervix and vagina. A Pap test can also identify the presence of infection or precancerous changes. Pap tests may be done: ? Every 3 years, starting at age 64. ? Every 5 years, starting after age 64, in combination with testing for human papillomavirus (HPV). ? More often or less often depending on other medical conditions you have, your age, and other risk factors.  It is your responsibility to get your Pap test results. Ask your health care provider or the department performing the test when your results will be ready.  Keep all follow-up visits as told by your health care provider. This is important. When should I seek medical care? Talk with your health care provider if:  You have any of these: ? Pain or swelling in your legs. ? Shortness of breath. ? Chest pain. ? Lumps or changes in your breasts or armpits. ? Slurred speech. ? Pain, burning, or bleeding when you urine.  You develop any of these: ? Unusual vaginal bleeding. ? Dizziness or headaches. ? Weakness or numbness in any part of your arms or legs. ? Pain in your abdomen.  This information is not intended to replace advice given to you by your health care provider. Make sure you discuss any questions you have with your health care provider. Document Released: 06/06/2003 Document Revised: 08/04/2016 Document Reviewed: 03/11/2015 Elsevier Interactive Patient Education  2017 [employer]

## 2017-10-13 NOTE — Progress Notes (Signed)
GYNECOLOGY PROGRESS NOTE  Subjective:    Patient ID: Megan Mooney, female    DOB: 1956/02/02, 62 y.o.   MRN: [id number]  HPI  Patient is a 62 y.o. 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 Patient reports that she has a PCP, but has not seen a GYN in several years.  Her symptoms have been ongoing since November 2018, and include irritation, stinging. Sexually active with same partner of 32 years.   She is also reporting mild to moderate hot flushes.  She is currently on Spironolactone to help with vasomotor symptoms.  She had tried HRT in the past (but thinks it was actually birth control), however this caused some abnormal bleeding and so she had to stop. Has also tried herbal remedies in the past. Notes that the spironolactone has helped, but symptoms have reached a plateau.    GYN History:  No LMP recorded. Patient is postmenopausal. Last pap smear: 09/2015.  Results were: Normal Last mammogram: 08/25/2017.  Results were: Normal Last Colonoscopy: 03/2012.  Results were normal.     Past Medical History:  Diagnosis Date  . Allergy    seasonal, uses inhaler during this time  . Arthritis    neck, shoulders.  no limitations  . Depression   . Diabetes mellitus without complication (HCC)   . Gastropathy   . GERD (gastroesophageal reflux disease)   . Hyperlipidemia   . Hypertension     Family History  Problem Relation Age of Onset  . Hypertension Mother   . Hyperlipidemia Mother   . Hypertension Father   . Hyperlipidemia Father   . Hyperlipidemia Brother   . Hypertension Brother   . Colon cancer Neg Hx   . Esophageal cancer Neg Hx   . Stomach cancer Neg Hx   . Breast cancer Neg Hx     Social History   Socioeconomic History  . Marital status: Married    Spouse name: Not on file  . Number of children: Not on file  . Years of education: Not on file  . Highest education level: Not on  file  Social Needs  . Financial resource strain: Not on file  . Food insecurity - worry: Not on file  . Food insecurity - inability: Not on file  . Transportation needs - medical: Not on file  . Transportation needs - non-medical: Not on file  Occupational History  . Not on file  Tobacco Use  . Smoking status: Former Smoker    Packs/day: 0.25    Years: 10.00    Pack years: 2.50    Types: Cigarettes    Last attempt to quit: 11/04/1986    Years since quitting: 30.9  . Smokeless tobacco: Never Used  Substance and Sexual Activity  . Alcohol use: No    Alcohol/week: 0.0 oz    Comment: 1 glass wine/month  . Drug use: No  . Sexual activity: Not on file  Other Topics Concern  . Not on file  Social History Narrative  . Not on file    Current Outpatient Medications on File Prior to Visit  Medication Sig Dispense Refill  . ACCU-CHEK FASTCLIX LANCETS MISC     . acyclovir cream (ZOVIRAX) 5 % Apply 1 application topically 5 (five) times daily. For 7 days with flare. 15 g 2  . amitriptyline (ELAVIL) 10 MG tablet Take 10 mg by mouth at bedtime.    Marland Kitchen  aspirin 81 MG tablet Take 81 mg by mouth daily.    . cetirizine (ZYRTEC) 10 MG tablet Take 1 tablet (10 mg total) by mouth daily. 90 tablet 1  . desoximetasone (TOPICORT) 0.05 % cream Apply topically.    . dutasteride (AVODART) 0.5 MG capsule     . empagliflozin (JARDIANCE) 25 MG TABS tablet Take 25 mg by mouth daily.    Marland Kitchen. ezetimibe (ZETIA) 10 MG tablet Take by mouth.    . fluticasone (FLONASE) 50 MCG/ACT nasal spray Place 1 spray into both nostrils every morning. Reported on 10/01/2015 48 g 2  . FREESTYLE LITE test strip     . insulin aspart (NOVOLOG) 100 UNIT/ML FlexPen Inject 15 Units into the skin daily.    . Insulin Detemir (LEVEMIR) 100 UNIT/ML Pen Inject 80 Units into the skin daily. 35 units AM, 45 units PM    . losartan-hydrochlorothiazide (HYZAAR) 100-12.5 MG tablet Take 1 tablet by mouth daily. 90 tablet 0  . metFORMIN (GLUMETZA)  1000 MG (MOD) 24 hr tablet Take one tablet in the am with half tablet in the pm.    . NOVOFINE 30G X 8 MM MISC     . omeprazole (PRILOSEC) 20 MG capsule TAKE 1 CAPSULE DAILY 90 capsule 3  . PROAIR HFA 108 (90 Base) MCG/ACT inhaler INHALE 2 PUFFS 4 TIMES DAILY AS NEEDED 3 Inhaler 4  . simvastatin (ZOCOR) 20 MG tablet Take 20 mg by mouth daily.    Marland Kitchen. triamcinolone cream (KENALOG) 0.1 % Apply 1 application topically 2 (two) times daily. 30 g 0  . valACYclovir (VALTREX) 500 MG tablet Take 1 tablet (500 mg total) by mouth daily. 90 tablet 3  . cyclobenzaprine (FLEXERIL) 10 MG tablet Take 1 tablet (10 mg total) by mouth at bedtime. (Patient not taking: Reported on 10/13/2017) 30 tablet 0  . ipratropium (ATROVENT) 0.06 % nasal spray Place 2 sprays into both nostrils 4 (four) times daily. For up to 5-7 days then stop. (Patient not taking: Reported on 10/13/2017) 15 mL 0  . oxyCODONE-acetaminophen (ROXICET) 5-325 MG tablet Take 1-2 tablets by mouth every 4 (four) hours as needed for severe pain. (Patient not taking: Reported on 10/13/2017) 30 tablet 0   No current facility-administered medications on file prior to visit.     Allergies  Allergen Reactions  . Ibuprofen Shortness Of Breath    Shortness of breath. Pt reports has taken recently without issues. Shortness of breath. 10/30/2016 Pt states that she has been taking ibuprofen with no adverse reaction. Shortness of breath. Pt reports has taken recently without issues.  . Nsaids Shortness Of Breath  . Tolmetin Shortness Of Breath     Review of Systems Pertinent items noted in HPI and remainder of comprehensive ROS otherwise negative.     Objective:   Blood pressure (!) 90/59, pulse 94, height 5\' 3"  (1.6 m), weight 161 lb 9.6 oz (73.3 kg). General appearance: alert and no distress Abdomen: soft, non-tender; bowel sounds normal; no masses,  no organomegaly Pelvic: external genitalia with moderate vulvar atrophy.  Rectovaginal septum normal.   Vagina without discharge, but with significant vaginal atrophy.  Cervix normal appearing, no lesions and no motion tenderness.  Uterus mobile, nontender, normal shape and size.  Adnexae non-palpable, nontender bilaterally.  Extremities: extremities normal, atraumatic, no cyanosis or edema Neurologic: Grossly normal   Assessment:  Encounter to establish care Vasomotor symptoms Atrophic vaginitis H/o HSV  Plan:   - Patient for establishment of care.  GYN screenings up  to date.  - Patient with bothersome menopausal vasomotor symptoms. Discussed lifestyle interventions such as wearing light clothing, remaining in cool environments, having fan/air conditioner in the room, avoiding hot beverages etc.  Discussed using hormone therapy and concerns about increased risk of heart disease, cerebrovascular disease, thromboembolic disease,  and breast cancer.  Also discussed other medical options such as Paxil, Effexor, Brisdelle, Clonidine,  or Neurontin.   Also discussed alternative therapies such as herbal remedies but cautioned that most of the products contained phytoestrogens (plant estrogens) in unregulated amounts which can have the same effects on the body as the pharmaceutical estrogen preparations.   Patient opted for estrogen therapy. for now.  Will discontinue Spironolactone and start Estradiol 1 mg (21 days on/21 days).   - Will treat atrophic vaginitis with estrogen cream for ~ 6-8 weeks.  Then can discontinue as she is also taking oral formulation.  She will return in 4 weeks for reevaluation.  - H/o HSV, patient notes that she wonders if she truly has the diagnosis.  States that she had vaginal irritation ~ 4-5 years ago and was diagnosed at that time, but denies ever seeing lesions.  No lesions seen today. Will order blood test to confirm. Will notify in MyChart of results.    Hildred Laser, MD [facility]

## 2017-10-15 LAB — HSV(HERPES SMPLX)ABS-I+II(IGG+IGM)-BLD
HSV 1 Glycoprotein G Ab, IgG: 23.1 index — ABNORMAL HIGH (ref 0.00–0.90)
HSV 2 IgG, Type Spec: 0.96 index — ABNORMAL HIGH (ref 0.00–0.90)
HSVI/II Comb IgM: <0.91 Ratio (ref 0.00–0.90)

## 2017-10-15 LAB — HSV-2 IGG SUPPLEMENTAL TEST: HSV-2 IgG Supplemental Test: POSITIVE — AB

## 2017-10-21 ENCOUNTER — Encounter: Payer: Self-pay | Admitting: Student

## 2017-10-22 ENCOUNTER — Ambulatory Visit: Admitting: Anesthesiology

## 2017-10-22 ENCOUNTER — Encounter
Admission: RE | Disposition: A | Discharge status: Home / Self Care | Payer: Self-pay | Source: Ambulatory Visit | Attending: Gastroenterology

## 2017-10-22 ENCOUNTER — Ambulatory Visit
Admission: RE | Admit: 2017-10-22 | Discharge: 2017-10-22 | Disposition: A | Discharge status: Home / Self Care | Source: Ambulatory Visit | Attending: Gastroenterology | Admitting: Gastroenterology

## 2017-10-22 ENCOUNTER — Encounter: Payer: Self-pay | Admitting: *Deleted

## 2017-10-22 DIAGNOSIS — Z79899 Other long term (current) drug therapy: Secondary | ICD-10-CM | POA: Insufficient documentation

## 2017-10-22 DIAGNOSIS — Z794 Long term (current) use of insulin: Secondary | ICD-10-CM | POA: Diagnosis not present

## 2017-10-22 DIAGNOSIS — Z886 Allergy status to analgesic agent status: Secondary | ICD-10-CM | POA: Insufficient documentation

## 2017-10-22 DIAGNOSIS — F329 Major depressive disorder, single episode, unspecified: Secondary | ICD-10-CM | POA: Insufficient documentation

## 2017-10-22 DIAGNOSIS — E119 Type 2 diabetes mellitus without complications: Secondary | ICD-10-CM | POA: Diagnosis not present

## 2017-10-22 DIAGNOSIS — E785 Hyperlipidemia, unspecified: Secondary | ICD-10-CM | POA: Diagnosis not present

## 2017-10-22 DIAGNOSIS — D649 Anemia, unspecified: Secondary | ICD-10-CM | POA: Diagnosis not present

## 2017-10-22 DIAGNOSIS — Z1211 Encounter for screening for malignant neoplasm of colon: Secondary | ICD-10-CM | POA: Insufficient documentation

## 2017-10-22 DIAGNOSIS — M1990 Unspecified osteoarthritis, unspecified site: Secondary | ICD-10-CM | POA: Insufficient documentation

## 2017-10-22 DIAGNOSIS — I10 Essential (primary) hypertension: Secondary | ICD-10-CM | POA: Diagnosis not present

## 2017-10-22 DIAGNOSIS — J45909 Unspecified asthma, uncomplicated: Secondary | ICD-10-CM | POA: Diagnosis not present

## 2017-10-22 DIAGNOSIS — Z8371 Family history of colonic polyps: Secondary | ICD-10-CM | POA: Diagnosis not present

## 2017-10-22 DIAGNOSIS — K219 Gastro-esophageal reflux disease without esophagitis: Secondary | ICD-10-CM | POA: Diagnosis not present

## 2017-10-22 DIAGNOSIS — Z7982 Long term (current) use of aspirin: Secondary | ICD-10-CM | POA: Insufficient documentation

## 2017-10-22 HISTORY — PX: COLONOSCOPY WITH PROPOFOL: SHX5780

## 2017-10-22 HISTORY — DX: Anemia, unspecified: D64.9

## 2017-10-22 HISTORY — DX: Unspecified asthma, uncomplicated: J45.909

## 2017-10-22 LAB — GLUCOSE, CAPILLARY: Glucose-Capillary: 72 mg/dL (ref 65–99)

## 2017-10-22 SURGERY — COLONOSCOPY WITH PROPOFOL
Anesthesia: General

## 2017-10-22 MED ORDER — PROPOFOL 500 MG/50ML IV EMUL
INTRAVENOUS | Status: DC | PRN
  Administered 2017-10-22: 150 ug/kg/min via INTRAVENOUS

## 2017-10-22 MED ORDER — LIDOCAINE HCL (CARDIAC) 20 MG/ML IV SOLN
INTRAVENOUS | Status: DC | PRN
  Administered 2017-10-22: 80 mg via INTRAVENOUS

## 2017-10-22 MED ORDER — PROPOFOL 500 MG/50ML IV EMUL
INTRAVENOUS | Status: AC
  Filled 2017-10-22: qty 50

## 2017-10-22 MED ORDER — SODIUM CHLORIDE 0.9 % IV SOLN: INTRAVENOUS | Status: DC

## 2017-10-22 MED ORDER — PHENYLEPHRINE HCL 10 MG/ML IJ SOLN
INTRAMUSCULAR | Status: DC | PRN
  Administered 2017-10-22 (×3): 200 ug via INTRAVENOUS

## 2017-10-22 MED ORDER — SODIUM CHLORIDE 0.9 % IV SOLN
INTRAVENOUS | Status: DC
  Administered 2017-10-22 (×2): via INTRAVENOUS

## 2017-10-22 MED ORDER — LIDOCAINE HCL (PF) 2 % IJ SOLN
INTRAMUSCULAR | Status: AC
  Filled 2017-10-22: qty 10

## 2017-10-22 MED ORDER — PROPOFOL 10 MG/ML IV BOLUS
INTRAVENOUS | Status: DC | PRN
  Administered 2017-10-22: 50 mg via INTRAVENOUS

## 2017-10-22 NOTE — Op Note (Signed)
[facility] Gastroenterology Patient Name: Megan ReaperJanet Mooney Procedure Date: 10/22/2017 8:24 AM MRN: [id number] Account #: [id number] Date of Birth: 06/18/1956 Admit Type: Outpatient Age: 62 [facility] Gender: Female Note Status: Finalized Procedure:            Colonoscopy Indications:          Family history of colonic polyps in a first-degree                        relative Providers:            Christena DeemMartin U. Asalee Barrette, MD Referring MD:         Noralyn PickKeri W. Macdonald (Referring MD) Medicines:            Monitored Anesthesia Care Complications:        No immediate complications. Procedure:            Pre-Anesthesia Assessment:                       - ASA Grade Assessment: II - A patient with mild                        systemic disease.                       After obtaining informed consent, the colonoscope was                        passed under direct vision. Throughout the procedure,                        the patient's blood pressure, pulse, and oxygen                        saturations were monitored continuously. The                        Colonoscope was introduced through the anus and                        advanced to the hepatic flexure. The patient tolerated                        the procedure well. The quality of the bowel                        preparation was good. The second Colonoscope was then                        introduced through the anus and advanced to the the                        cecum, identified by appendiceal orifice and ileocecal                        valve. Findings:      The colon (entire examined portion) appeared normal. Scope was changed       during the case due to a malfunction.      The retroflexed view of the distal rectum and anal verge was normal and  showed no anal or rectal abnormalities.      The digital rectal exam was normal. Impression:           - The entire examined colon is normal.                       -  The distal rectum and anal verge are normal on                        retroflexion view.                       - No specimens collected. Recommendation:       - Discharge patient to home.                       - Repeat colonoscopy in 5 years for screening purposes. Procedure Code(s):    --- Professional ---                       [phone], Colonoscopy, flexible; diagnostic, including                        collection of specimen(s) by brushing or washing, when                        performed (separate procedure) Diagnosis Code(s):    --- Professional ---                       Z83.71, Family history of colonic polyps CPT copyright 2016 American Medical Association. All rights reserved. The codes documented in this report are preliminary and upon coder review may  be revised to meet current compliance requirements. Christena Deem, MD 10/22/2017 9:07:33 AM This report has been signed electronically. Number of Addenda: 0 Note Initiated On: 10/22/2017 8:24 AM Scope Withdrawal Time: 0 hours 9 minutes 45 seconds  Total Procedure Duration: 0 hours 31 minutes 22 seconds       [facility]

## 2017-10-22 NOTE — H&P (Signed)
Outpatient short stay form Pre-procedure 10/22/2017 8:07 AM Megan Mooney  Primary Physician: Dr. Sedalia MutaKeri Mooney  Reason for visit: Colonoscopy  History of present illness: Family history of adenomatous colon polyps.  Patient is a 62 year old female presenting today as above.  Family history of colon polyps in father.  Patient had her last colonoscopy about 5 years ago this was negative for colon polyps at that time.    Current Facility-Administered Medications:  .  0.9 %  sodium chloride infusion, , Intravenous, Continuous, Megan DeemSkulskie, Martin U, Mooney .  0.9 %  sodium chloride infusion, , Intravenous, Continuous, Megan DeemSkulskie, Martin U, Mooney  Medications Prior to Admission  Medication Sig Dispense Refill Last Dose  . Aspirin-Calcium Carbonate 81-777 MG TABS Take by mouth.   10/15/2017  . cetirizine (ZYRTEC) 10 MG tablet Take 1 tablet (10 mg total) by mouth daily. 90 tablet 1 10/22/2017 at Unknown time  . conjugated estrogens (PREMARIN) vaginal cream Place 1 Applicatorful vaginally daily. Use 1 gram daily for 4 weeks, then decrease to twice weekly (Mon/Thurs or Tues/Friday). 42.5 g 1 10/21/2017 at Unknown time  . dutasteride (AVODART) 0.5 MG capsule    10/22/2017 at Unknown time  . empagliflozin (JARDIANCE) 25 MG TABS tablet Take 25 mg by mouth daily.   10/21/2017 at Unknown time  . estradiol (ESTRACE) 1 MG tablet Take 1 tablet (1 mg total) by mouth daily. Take tablet for 21 days, then rest for 7 days each month 21 tablet 11 10/22/2017 at Unknown time  . fluticasone (FLONASE) 50 MCG/ACT nasal spray Place 1 spray into both nostrils every morning. Reported on 10/01/2015 48 g 2 10/21/2017 at Unknown time  . Insulin Detemir (LEVEMIR) 100 UNIT/ML Pen Inject 80 Units into the skin daily. 35 units AM, 45 units PM   10/21/2017 at Unknown time  . ipratropium (ATROVENT) 0.06 % nasal spray Place 2 sprays into both nostrils 4 (four) times daily. For up to 5-7 days then stop. 15 mL 0 10/21/2017 at Unknown time  .  losartan-hydrochlorothiazide (HYZAAR) 100-12.5 MG tablet Take 1 tablet by mouth daily. 90 tablet 0 10/22/2017 at Unknown time  . metFORMIN (GLUMETZA) 1000 MG (MOD) 24 hr tablet Take one tablet in the am with half tablet in the pm.   Past Week at Unknown time  . simvastatin (ZOCOR) 20 MG tablet Take 20 mg by mouth daily.   10/21/2017 at Unknown time  . valACYclovir (VALTREX) 500 MG tablet Take 1 tablet (500 mg total) by mouth daily. 90 tablet 3 10/21/2017 at Unknown time  . ACCU-CHEK FASTCLIX LANCETS MISC    Not Taking at Unknown time  . acyclovir cream (ZOVIRAX) 5 % Apply 1 application topically 5 (five) times daily. For 7 days with flare. (Patient not taking: Reported on 10/22/2017) 15 g 2 Not Taking at Unknown time  . amitriptyline (ELAVIL) 10 MG tablet Take 10 mg by mouth at bedtime.   10/20/2017  . aspirin 81 MG tablet Take 81 mg by mouth daily.   10/15/2017  . azithromycin (ZITHROMAX) 250 MG tablet Take by mouth daily.   Not Taking at Unknown time  . benzonatate (TESSALON) 100 MG capsule Take by mouth 3 (three) times daily as needed for cough.   Not Taking at Unknown time  . cyclobenzaprine (FLEXERIL) 10 MG tablet Take 1 tablet (10 mg total) by mouth at bedtime. (Patient not taking: Reported on 10/13/2017) 30 tablet 0 Not Taking at Unknown time  . desoximetasone (TOPICORT) 0.05 % cream Apply topically.  Not Taking at Unknown time  . ezetimibe (ZETIA) 10 MG tablet Take by mouth.   10/20/2017  . FREESTYLE LITE test strip    Not Taking at Unknown time  . gabapentin (NEURONTIN) 100 MG capsule Take 100 mg by mouth 3 (three) times daily.   Not Taking at Unknown time  . glucagon 1 MG injection Inject 1 mg into the vein once as needed.   Not Taking at Unknown time  . guaiFENesin-codeine 100-10 MG/5ML syrup Take 5 mLs by mouth 3 (three) times daily as needed for cough.   Not Taking at Unknown time  . insulin aspart (NOVOLOG) 100 UNIT/ML FlexPen Inject 15 Units into the skin daily.   10/20/2017  .  methylPREDNISolone (MEDROL DOSEPAK) 4 MG TBPK tablet Take by mouth.   Not Taking at Unknown time  . Multiple Vitamins-Calcium (MULTI-DAY/CALCIUM/EXTRA IRON PO) Take by mouth.   Not Taking at Unknown time  . NOVOFINE 30G X 8 MM MISC    Not Taking at Unknown time  . omeprazole (PRILOSEC) 20 MG capsule TAKE 1 CAPSULE DAILY 90 capsule 3 10/20/2017  . oxyCODONE-acetaminophen (ROXICET) 5-325 MG tablet Take 1-2 tablets by mouth every 4 (four) hours as needed for severe pain. (Patient not taking: Reported on 10/13/2017) 30 tablet 0 Not Taking at Unknown time  . PROAIR HFA 108 (90 Base) MCG/ACT inhaler INHALE 2 PUFFS 4 TIMES DAILY AS NEEDED (Patient not taking: Reported on 10/22/2017) 3 Inhaler 4 Not Taking at Unknown time  . triamcinolone cream (KENALOG) 0.1 % Apply 1 application topically 2 (two) times daily. (Patient not taking: Reported on 10/22/2017) 30 g 0 Not Taking at Unknown time  . Vitamin D, Ergocalciferol, (DRISDOL) 50000 units CAPS capsule Take 50,000 Units by mouth every 7 (seven) days.   Not Taking at Unknown time     Allergies  Allergen Reactions  . Ibuprofen Shortness Of Breath    Shortness of breath. Pt reports has taken recently without issues. Shortness of breath. 10/30/2016 Pt states that she has been taking ibuprofen with no adverse reaction. Shortness of breath. Pt reports has taken recently without issues.  . Nsaids Shortness Of Breath  . Tolmetin Shortness Of Breath     Past Medical History:  Diagnosis Date  . Allergy    seasonal, uses inhaler during this time  . Anemia   . Arthritis    neck, shoulders.  no limitations  . Asthma   . Depression   . Diabetes mellitus without complication (HCC)   . Gastropathy   . GERD (gastroesophageal reflux disease)   . Hyperlipidemia   . Hypertension     Review of systems:      Physical Exam    Heart and lungs: You rate and rhythm without rub or gallop, lungs are bilaterally clear.    HEENT: Normocephalic atraumatic eyes are  anicteric    Other:    Pertinant exam for procedure: Soft nontender nondistended bowel sounds positive normoactive.    Planned proceedures: Anoscopy and indicated procedures. I have discussed the risks benefits and complications of procedures to include not limited to bleeding, infection, perforation and the risk of sedation and the patient wishes to proceed.    Megan Deem, [facility] 10/22/2017  8:07 AM

## 2017-10-22 NOTE — Anesthesia Preprocedure Evaluation (Signed)
Anesthesia Evaluation  Patient identified by MRN, date of birth, ID band Patient awake    Reviewed: Allergy & Precautions, NPO status , Patient's Chart, lab work & pertinent test results  History of Anesthesia Complications Negative for: history of anesthetic complications  Airway Mallampati: II  TM Distance: >3 FB Neck ROM: Full    Dental no notable dental hx.    Pulmonary neg sleep apnea, neg COPD, former smoker,    breath sounds clear to auscultation- rhonchi (-) wheezing      Cardiovascular Exercise Tolerance: Good hypertension, Pt. on medications (-) CAD, (-) Past MI, (-) Cardiac Stents and (-) CABG  Rhythm:Regular Rate:Normal - Systolic murmurs and - Diastolic murmurs    Neuro/Psych PSYCHIATRIC DISORDERS Depression negative neurological ROS     GI/Hepatic Neg liver ROS, GERD  ,  Endo/Other  diabetes, Insulin Dependent, Oral Hypoglycemic Agents  Renal/GU negative Renal ROS     Musculoskeletal  (+) Arthritis ,   Abdominal (+) - obese,   Peds  Hematology  (+) anemia ,   Anesthesia Other Findings Past Medical History: No date: Allergy     Comment:  seasonal, uses inhaler during this time No date: Anemia No date: Arthritis     Comment:  neck, shoulders.  no limitations No date: Asthma No date: Depression No date: Diabetes mellitus without complication (HCC) No date: Gastropathy No date: GERD (gastroesophageal reflux disease) No date: Hyperlipidemia No date: Hypertension   Reproductive/Obstetrics                             Anesthesia Physical Anesthesia Plan  ASA: II  Anesthesia Plan: General   Post-op Pain Management:    Induction: Intravenous  PONV Risk Score and Plan: 2 and Propofol infusion  Airway Management Planned: Natural Airway  Additional Equipment:   Intra-op Plan:   Post-operative Plan:   Informed Consent: I have reviewed the patients History and  Physical, chart, labs and discussed the procedure including the risks, benefits and alternatives for the proposed anesthesia with the patient or authorized representative who has indicated his/her understanding and acceptance.   Dental advisory given  Plan Discussed with: CRNA and Anesthesiologist  Anesthesia Plan Comments:         Anesthesia Quick Evaluation

## 2017-10-22 NOTE — Transfer of Care (Signed)
Immediate Anesthesia Transfer of Care Note  Patient: Megan ReaperJanet Minter  Procedure(s) Performed: COLONOSCOPY WITH PROPOFOL ([name] )  Patient Location: Endoscopy Unit  Anesthesia Type:General  Level of Consciousness: drowsy and patient cooperative  Airway & Oxygen Therapy: Patient Spontanous Breathing and Patient connected to nasal cannula oxygen  Post-op Assessment: Report given to RN and Post -op Vital signs reviewed and stable  Post vital signs: Reviewed and stable  Last Vitals:  Vitals:   10/22/17 0757 10/22/17 0907  BP: 106/77 (P) 104/66  Pulse: 94   Resp: 16   Temp: (!) 35.9 C (!) (P) 36.4 C  SpO2: 100%     Last Pain:  Vitals:   10/22/17 0757  TempSrc: Tympanic         Complications: No apparent anesthesia complications

## 2017-10-22 NOTE — Anesthesia Postprocedure Evaluation (Signed)
Anesthesia Post Note  Patient: Earleen ReaperJanet Burdine  Procedure(s) Performed: COLONOSCOPY WITH PROPOFOL ([name] )  Patient location during evaluation: Endoscopy Anesthesia Type: General Level of consciousness: awake and alert and oriented Pain management: pain level controlled Vital Signs Assessment: post-procedure vital signs reviewed and stable Respiratory status: spontaneous breathing, nonlabored ventilation and respiratory function stable Cardiovascular status: blood pressure returned to baseline and stable Postop Assessment: no signs of nausea or vomiting Anesthetic complications: no     Last Vitals:  Vitals:   10/22/17 0927 10/22/17 0937  BP: 92/71 92/68  Pulse:    Resp: 10 12  Temp:    SpO2: 100% 98%    Last Pain:  Vitals:   10/22/17 0937  TempSrc:   PainSc: 0-No pain                 Marquay Kruse

## 2017-10-22 NOTE — Anesthesia Post-op Follow-up Note (Signed)
Anesthesia QCDR form completed.        

## 2017-10-25 ENCOUNTER — Encounter: Payer: Self-pay | Admitting: Gastroenterology

## 2017-10-29 ENCOUNTER — Other Ambulatory Visit: Payer: Self-pay

## 2017-10-29 MED ORDER — ESTROGENS, CONJUGATED 0.625 MG/GM VA CREA: 0.5000 | TOPICAL_CREAM | VAGINAL | 0 refills | Status: DC

## 2017-11-01 ENCOUNTER — Telehealth: Payer: Self-pay | Admitting: *Deleted

## 2017-11-01 NOTE — Telephone Encounter (Signed)
Spoke to [facility] about her Premarin cream doses and amount was figured out and prescription is being filled now.

## 2017-11-01 NOTE — Telephone Encounter (Signed)
Marcelle Smilingatasha calling from [facility] is needing clarification on the RX Premarin Cream . They are needing a quantity clarification for 90 days .Please call . [phone] Reference # [id number]

## 2017-11-10 ENCOUNTER — Encounter: Payer: Self-pay | Admitting: Obstetrics and Gynecology

## 2017-11-10 ENCOUNTER — Ambulatory Visit (INDEPENDENT_AMBULATORY_CARE_PROVIDER_SITE_OTHER): Admitting: Obstetrics and Gynecology

## 2017-11-10 VITALS — BP 129/78 | HR 106 | Wt 160.7 lb

## 2017-11-10 DIAGNOSIS — Z79890 Hormone replacement therapy (postmenopausal): Secondary | ICD-10-CM | POA: Diagnosis not present

## 2017-11-10 DIAGNOSIS — N951 Menopausal and female climacteric states: Secondary | ICD-10-CM

## 2017-11-10 DIAGNOSIS — N905 Atrophy of vulva: Secondary | ICD-10-CM | POA: Diagnosis not present

## 2017-11-10 DIAGNOSIS — N9089 Other specified noninflammatory disorders of vulva and perineum: Secondary | ICD-10-CM

## 2017-11-10 NOTE — Progress Notes (Signed)
    GYNECOLOGY PROGRESS NOTE  Subjective:    Patient ID: Megan Mooney, female    DOB: 07/18/1956, 62 y.o.   MRN: [id number]  HPI  Patient is a 62 y.o. [id number] female who presents for 4 week f/u for menopausal symptoms and severe vulvar atrophy.  Patient is currently was changed last visit from Spironolactone to Estradiol for vasomotor symptoms, which have significantly improved her symptoms. She was also prescribed Premarin cream for vulvar atrophy.  Patient notes that she is experiencing itching and burning in the vulvar area with use.  Usually burning occurs shortly after application. Was using daily up until this past week, and this week is dropping to twice weekly use as prescribed.  Notes that she was using "urinary pads" for possible leakage as she was using the Premarin cream during the day.   The following portions of the patient's history were reviewed and updated as appropriate: allergies, current medications, past family history, past medical history, past social history, past surgical history and problem list.  Review of Systems Pertinent items noted in HPI and remainder of comprehensive ROS otherwise negative.   Objective:   Blood pressure 129/78, pulse (!) 106, weight 160 lb 11.2 oz (72.9 kg). General appearance: alert and no distress Pelvic: external genitalia with mild to moderate atrophy of labia minora and majora (however markedly improved since last visit). Rectovaginal septum normal.  Internal exam not performed.    Assessment:   Menopausal vasomotor symptoms on HRT Vulvar atrophy Vulvar irritation  Plan:   - Patient can continue on HRT with estradiol. Currently doing regimen of 21 days on, 7 days off.  Will continue to assess menopausal symptoms yearly.  - Vulvar atrophy has improved with use of Premarin cream.  Advised that decreasing usage down to 2 times per week should decrease her vulvar irritation.  If it does not, can change to a different estrogen cream.  On  the days she is not using Premarin cream, she can use coconut oil/mineral oil on vulva.  Also encouraged to use Premarin cream at night to avoid having to use pads during the day, which could cause more irritation.  Once she has gotten to a point where symptoms have resolved, can use cream prn.   To f/u as needed, within 1 year for annual exam.

## 2017-11-10 NOTE — Progress Notes (Signed)
Pt is still having itching and burning in the vaginal area.

## 2018-01-04 ENCOUNTER — Ambulatory Visit (INDEPENDENT_AMBULATORY_CARE_PROVIDER_SITE_OTHER): Admitting: Obstetrics and Gynecology

## 2018-01-04 ENCOUNTER — Encounter: Payer: Self-pay | Admitting: Obstetrics and Gynecology

## 2018-01-04 ENCOUNTER — Other Ambulatory Visit: Payer: Self-pay | Admitting: Obstetrics and Gynecology

## 2018-01-04 ENCOUNTER — Telehealth: Payer: Self-pay | Admitting: Obstetrics and Gynecology

## 2018-01-04 VITALS — BP 110/73 | HR 105 | Ht 63.0 in | Wt 163.1 lb

## 2018-01-04 DIAGNOSIS — Z79890 Hormone replacement therapy (postmenopausal): Secondary | ICD-10-CM

## 2018-01-04 DIAGNOSIS — N950 Postmenopausal bleeding: Secondary | ICD-10-CM | POA: Diagnosis not present

## 2018-01-04 DIAGNOSIS — N951 Menopausal and female climacteric states: Secondary | ICD-10-CM

## 2018-01-04 MED ORDER — CONJ ESTROG-MEDROXYPROGEST ACE 0.625-5 MG PO TABS: 1.0000 | ORAL_TABLET | Freq: Every day | ORAL | 11 refills | Status: DC

## 2018-01-04 NOTE — Telephone Encounter (Signed)
[facility] guess it will be okay to just put her on the schedule.

## 2018-01-04 NOTE — Patient Instructions (Signed)
Postmenopausal Bleeding Postmenopausal bleeding is any bleeding after menopause. Menopause is when a woman's period stops. Any type of bleeding after menopause is concerning. It should be checked by your doctor. Any treatment will depend on the cause. Follow these instructions at home: Watch your condition for any changes.  Avoid the use of tampons and douches as told by your doctor.  Change your pads often.  Get regular pelvic exams and Pap tests.  Keep all appointments for tests as told by your doctor.  Contact a doctor if:  Your bleeding lasts for more than 1 week.  You have belly (abdominal) pain.  You have bleeding after sex (intercourse). Get help right away if:  You have a fever, chills, a headache, dizziness, muscle aches, and bleeding.  You have strong pain with bleeding.  You have clumps of blood (blood clots) coming from your vagina.  You have bleeding and need more than 1 pad an hour.  You feel like you are going to pass out (faint). This information is not intended to replace advice given to you by your health care provider. Make sure you discuss any questions you have with your health care provider. Document Released: 06/16/2008 Document Revised: 02/13/2016 Document Reviewed: 04/06/2013 Elsevier Interactive Patient Education  2017 Elsevier Inc.  

## 2018-01-04 NOTE — Progress Notes (Signed)
Pt has been bleeding since yesterday. Bleeding is heavier than spotting.

## 2018-01-04 NOTE — Telephone Encounter (Signed)
The patient called and stated that she is experiencing post menopausal bleeding and would lie to get an appointment with Dr. Valentino Saxonherry as soon as possible but we do not have any availability. Im asking for permission to double book or find a solution for the patient. Please advise.

## 2018-01-05 ENCOUNTER — Ambulatory Visit (INDEPENDENT_AMBULATORY_CARE_PROVIDER_SITE_OTHER)

## 2018-01-05 DIAGNOSIS — N950 Postmenopausal bleeding: Secondary | ICD-10-CM

## 2018-01-08 ENCOUNTER — Encounter: Payer: Self-pay | Admitting: Obstetrics and Gynecology

## 2018-01-08 NOTE — Progress Notes (Signed)
    GYNECOLOGY PROGRESS NOTE  Subjective:    Patient ID: Megan Mooney, female    DOB: 10/06/1955, 62 y.o.   MRN: [id number]  HPI  Patient is a 62 y.o. [id number] female who presents for complaints of post-menopausal bleeding x 2 days. Notes that the bleeding is a little heavier than spotting.  Mostly seen when wiping.  Denies pelvic pain, dysuria.  She is currently on HRT (Estradiol 1 mg) for treatment of menopausal vasomotor symptoms. Does 21 days on/7 days off regimen. She has been on HRT for approximately 3 months. She has no significant personal or family h/o cancer.    The following portions of the patient's history were reviewed and updated as appropriate: allergies, current medications, past family history, past medical history, past social history, past surgical history and problem list.  Review of Systems Pertinent items noted in HPI and remainder of comprehensive ROS otherwise negative.   Objective:   Blood pressure 110/73, pulse (!) 105, height 5\' 3"  (1.6 m), weight 163 lb 1.6 oz (74 kg). General appearance: alert and no distress Abdomen: soft, non-tender; bowel sounds normal; no masses,  no organomegaly Pelvic: external genitalia with mild vulvar atrophy at labia majora and labia minora (improved from prior visits).  Rectovaginal septum normal.  Vagina with small amount of dark brown blood, no discharge.  Cervix normal appearing, no lesions and no motion tenderness.  Uterus mobile, nontender, normal shape and size.  Adnexae non-palpable, nontender bilaterally.  Extremities: extremities normal, atraumatic, no cyanosis or edema Neurologic: Grossly normal   Assessment:   PMB (postmenopausal bleeding)  Menopausal syndrome on hormone replacement therapy   Plan:   1. PMB, currently on HRT - will order pelvic ultrasound to assess endometrial lining. If thickened, will need endometrial biopsy to r/o malignancy/hyperplasia, and will need to discontinue HRT.  Will notify patient of  results of ultrasound, and manage accordingly.  2. 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  Patient may be dealing with progesterone deficiency.   However, if ultrasound notes thickened endometrium and biopsy notes malignancy or hyperplasia, will need to discontinue hormonal methods and consider alternative management options. However the likelihood of hyperplasia or malignancy is low as patient has only recently initiated HRT 3 months ago.    Hildred Laserherry, Aster Screws, MD [facility]

## 2018-01-16 ENCOUNTER — Encounter: Payer: Self-pay | Admitting: Obstetrics and Gynecology

## 2018-01-17 ENCOUNTER — Other Ambulatory Visit: Payer: Self-pay

## 2018-01-17 MED ORDER — CONJ ESTROG-MEDROXYPROGEST ACE 0.625-5 MG PO TABS: 1.0000 | ORAL_TABLET | Freq: Every day | ORAL | 3 refills | Status: DC

## 2018-01-25 ENCOUNTER — Encounter: Payer: Self-pay | Admitting: Obstetrics and Gynecology

## 2018-01-25 ENCOUNTER — Ambulatory Visit (INDEPENDENT_AMBULATORY_CARE_PROVIDER_SITE_OTHER): Admitting: Obstetrics and Gynecology

## 2018-01-25 VITALS — BP 103/68 | HR 120 | Ht 63.0 in | Wt 162.0 lb

## 2018-01-25 DIAGNOSIS — N950 Postmenopausal bleeding: Secondary | ICD-10-CM

## 2018-01-25 DIAGNOSIS — R9389 Abnormal findings on diagnostic imaging of other specified body structures: Secondary | ICD-10-CM

## 2018-01-25 DIAGNOSIS — Z79890 Hormone replacement therapy (postmenopausal): Secondary | ICD-10-CM

## 2018-01-25 DIAGNOSIS — R000 Tachycardia, unspecified: Secondary | ICD-10-CM | POA: Diagnosis not present

## 2018-01-25 DIAGNOSIS — N905 Atrophy of vulva: Secondary | ICD-10-CM | POA: Diagnosis not present

## 2018-01-25 MED ORDER — PAROXETINE HCL 10 MG PO TABS: 10.0000 mg | ORAL_TABLET | Freq: Every day | ORAL | 11 refills | Status: DC

## 2018-01-25 NOTE — Progress Notes (Addendum)
GYNECOLOGY PROGRESS NOTE  Subjective:    Patient ID: Megan Mooney, female    DOB: 1956/08/19, 62 y.o.   MRN: [id number]  HPI  Patient is a 62 y.o. [id number] female who presents for follow up of PMB.  She is currently on HRT (initiated in February) for menopausal syndrome symptoms. Presents to discuss ultrasound results and further management.   The following portions of the patient's history were reviewed and updated as appropriate: allergies, current medications, past family history, past medical history, past social history, past surgical history and problem list.  Review of Systems Pertinent items noted in HPI and remainder of comprehensive ROS otherwise negative.   Objective:   Blood pressure 103/68, pulse (!) 120, height  (1.6 m), weight 162 lb (73.5 kg). Body mass index is 28.7 kg/m.  Repeat pulse 105 (manual).   General appearance: alert and no distress Abdomen: soft, non-tender; bowel sounds normal; no masses,  no organomegaly Pelvic: external genitalia normal (improved form last visit with mild vulvar atrophy present), rectovaginal septum normal.  Vagina with small amount of dark red blood in vault.  Cervix normal appearing, no lesions and no motion tenderness.  Uterus mobile, nontender, normal shape and size.  Adnexae non-palpable, nontender bilaterally.  Extremities: extremities normal, atraumatic, no cyanosis or edema Neurologic: Grossly normal    Imaging:  US Transvaginal Non-OB ULTRASOUND REPORT  Location: [facility] Date of Service:  01/05/2018  Indications: PMB Findings:  The uterus measures 4.8 x 2.9 x 2.5 cm. Echo texture is heterogeneous without evidence of focal masses. The Endometrium measures 11.7 mm.  No obvious fibroids or polyps are noted  within the endometrium.  No vascularity noted.  Neither ovary was visualized due to overlying bowel gas. Survey of the adnexa demonstrates no adnexal masses. There is no free fluid in the cul de  sac.  Impression: 1. Retroflexed uterus appears of normal size. 2. The endometrium appears thickened at 11.7 mm.  No obvious fibroids or  polyps are noted within the endometrium.  No vascularity noted.  Recommendations: 1.Clinical correlation with the patient's History and Physical Exam.  Kari Baars, RDMS  Thickened endometrium for menopause. The ultrasound images and findings were reviewed by me and I agree with  the above report.  Elonda Husky, M.D. 01/06/2018 2:25 PM   Assessment:   PMB Menopausal on HRT Thickened endometrium Vulvar atrophy Tachcyardia Plan:   - Advised patient that due to her ultrasound results noting a thickened endometrium, she should discontinue oral HRT at this time.  Endometrial biopsy performed today in light of PMB and thickened endometrium. Will notify patient of results by phone unless hyperplasia/malignancy identified, then will need to come in to office for discussion.  Of note, patient reports that she had an episode of PMB bleeding in the past after use of oral HRT for ~ 4 years (this occurred in 2004). Had workup that was normal including endometrial biopsy at that time. But discontinued HRT use then.  - Will change from oral HRT to Paxil 10 mg for vasomotor symptoms. Also discussed local vaginal therapy due to treat vaginal atrophy. Depending on results of biopsy, patient may need to use alternative treatment options for atrophy.  For now, will consider trial of Intrarosa (sample given).  Advised to begin use after bleeding has subsided.  - Tachycardia, unknown cause.  Patient does appear to be a little anxious.  Repeat pulse improved. Will continue to monitor.     Hildred Laser, MD [facility]

## 2018-01-25 NOTE — Progress Notes (Signed)
Pt is having irregular heavy cycles with clots x 1 week ago along with cramps and pain with nausea.

## 2018-01-27 LAB — PATHOLOGY

## 2018-01-29 ENCOUNTER — Encounter: Payer: Self-pay | Admitting: Obstetrics and Gynecology

## 2018-01-31 ENCOUNTER — Other Ambulatory Visit: Payer: Self-pay | Admitting: Obstetrics and Gynecology

## 2018-01-31 DIAGNOSIS — B009 Herpesviral infection, unspecified: Secondary | ICD-10-CM

## 2018-01-31 MED ORDER — VALACYCLOVIR HCL 500 MG PO TABS: 500.0000 mg | ORAL_TABLET | Freq: Every day | ORAL | 3 refills | Status: DC

## 2018-01-31 MED ORDER — MEDROXYPROGESTERONE ACETATE 10 MG PO TABS: 10.0000 mg | ORAL_TABLET | Freq: Every day | ORAL | 0 refills | Status: DC

## 2018-02-01 ENCOUNTER — Other Ambulatory Visit: Payer: Self-pay | Admitting: Obstetrics and Gynecology

## 2018-02-11 ENCOUNTER — Encounter: Payer: Self-pay | Admitting: Obstetrics and Gynecology

## 2018-02-16 ENCOUNTER — Other Ambulatory Visit: Payer: Self-pay

## 2018-02-16 MED ORDER — PRASTERONE 6.5 MG VA INST: 6.5000 mg/d | VAGINAL_INSERT | Freq: Every day | VAGINAL | 3 refills | Status: DC

## 2018-08-08 ENCOUNTER — Other Ambulatory Visit: Payer: Self-pay | Admitting: Family

## 2018-08-08 DIAGNOSIS — Z1231 Encounter for screening mammogram for malignant neoplasm of breast: Secondary | ICD-10-CM

## 2018-08-29 ENCOUNTER — Ambulatory Visit
Admission: RE | Admit: 2018-08-29 | Discharge: 2018-08-29 | Disposition: A | Discharge status: Home / Self Care | Source: Ambulatory Visit | Attending: Family | Admitting: Family

## 2018-08-29 DIAGNOSIS — Z1231 Encounter for screening mammogram for malignant neoplasm of breast: Secondary | ICD-10-CM | POA: Diagnosis not present

## 2019-01-20 ENCOUNTER — Telehealth: Payer: Self-pay | Admitting: Obstetrics and Gynecology

## 2019-01-20 NOTE — Telephone Encounter (Signed)
Please another phone encounter.  

## 2019-01-20 NOTE — Telephone Encounter (Signed)
The patient called and stated that she is having stinging, possibly a cut that is causing discomfort in her vulva area. The patient is hoping to schedule an appointment or speak with someone as soon as possible. Please advise.

## 2019-07-17 ENCOUNTER — Other Ambulatory Visit: Payer: Self-pay | Admitting: Family

## 2019-07-17 DIAGNOSIS — Z1231 Encounter for screening mammogram for malignant neoplasm of breast: Secondary | ICD-10-CM

## 2019-07-25 ENCOUNTER — Ambulatory Visit: Admitting: Podiatry

## 2019-08-01 ENCOUNTER — Ambulatory Visit (INDEPENDENT_AMBULATORY_CARE_PROVIDER_SITE_OTHER): Payer: No Typology Code available for payment source | Admitting: Podiatry

## 2019-08-01 ENCOUNTER — Ambulatory Visit (INDEPENDENT_AMBULATORY_CARE_PROVIDER_SITE_OTHER): Payer: Non-veteran care

## 2019-08-01 ENCOUNTER — Other Ambulatory Visit: Payer: Self-pay

## 2019-08-01 ENCOUNTER — Encounter: Payer: Self-pay | Admitting: Podiatry

## 2019-08-01 DIAGNOSIS — G5791 Unspecified mononeuropathy of right lower limb: Secondary | ICD-10-CM

## 2019-08-01 DIAGNOSIS — M25519 Pain in unspecified shoulder: Secondary | ICD-10-CM | POA: Insufficient documentation

## 2019-08-01 DIAGNOSIS — M79673 Pain in unspecified foot: Secondary | ICD-10-CM

## 2019-08-01 DIAGNOSIS — M545 Low back pain, unspecified: Secondary | ICD-10-CM | POA: Insufficient documentation

## 2019-08-01 MED ORDER — PREGABALIN 150 MG PO CAPS: 150.0000 mg | ORAL_CAPSULE | Freq: Two times a day (BID) | ORAL | 2 refills | Status: DC

## 2019-08-01 NOTE — Progress Notes (Signed)
   HPI: 63 y.o. female presenting today as a referral from the [place] for evaluation of right foot pain.  Patient has a past SxHx of endoscopic plantar fasciotomy, retrocalcaneal exostectomy with repair of Achilles tendon on the right foot. DOS: 07/2016.  Patient states that she has had numbness and pain ever since surgery.  Patient has tried anti-inflammatory cortisone injections, physical therapy, acupuncture, Voltaren gel, and stretching modalities without any significant alleviation of symptoms. She recently saw Dr. Vickki Muff, [facility], however he is not in network with the [facility] and so she was referred here.  At the visit with Dr. Vickki Muff, she is prescribed Lyrica 75 mg 2 times daily which does help and provide minimal relief.  Past Medical History:  Diagnosis Date  . Allergy    seasonal, uses inhaler during this time  . Anemia   . Arthritis    neck, shoulders.  no limitations  . Asthma   . Depression   . Diabetes mellitus without complication ([place])   . Gastropathy   . GERD (gastroesophageal reflux disease)   . Hyperlipidemia   . Hypertension      Physical Exam: General: The patient is alert and oriented x3 in no acute distress.  Dermatology: Skin is warm, dry and supple bilateral lower extremities. Negative for open lesions or macerations.  Vascular: Palpable pedal pulses bilaterally. No edema or erythema noted. Capillary refill within normal limits.  Neurological: Epicritic and protective threshold grossly intact bilaterally.  Significant pain and hypersensitivity with light touch along the incision site to the posterior heel  Musculoskeletal Exam: Range of motion within normal limits to all pedal and ankle joints bilateral. Muscle strength 5/5 in all groups bilateral.   Radiographic Exam:  Normal osseous mineralization. Joint spaces preserved. No fracture/dislocation/boney destruction.    Assessment: 1.  Chronic pain/neuralgia right foot   Plan of Care:  1. Patient  evaluated. X-Rays reviewed.  2.  I did explain to the patient that this is likely a neurological etiology.  I also feel that there is nothing additional that I can provide that has not been attempted for treatment. 3.  I did increase the Lyrica from 75 mg 2 times daily to 150 mg 2 times daily.  Prescription printed so she can take at the [place] to have it filled 4.  Recommend that the patient be referred to neurology and pain management.  Referral needs to take place through the [place].  Patient understands 5.  Return to clinic as needed      Edrick Kins, DPM [facility]  Dr. Edrick Kins, DPM    [address], [place] [address]                Office [phone]  Fax [phone]

## 2019-08-03 ENCOUNTER — Other Ambulatory Visit: Payer: Self-pay | Admitting: Podiatry

## 2019-08-03 DIAGNOSIS — G5791 Unspecified mononeuropathy of right lower limb: Secondary | ICD-10-CM

## 2019-08-03 NOTE — Addendum Note (Signed)
Addended by: Graceann Congress D on: 08/03/2019 05:02 PM   Modules accepted: Orders

## 2019-08-06 ENCOUNTER — Encounter: Payer: Self-pay | Admitting: Podiatry

## 2019-08-09 ENCOUNTER — Encounter: Payer: Self-pay | Admitting: Podiatry

## 2019-08-11 NOTE — Telephone Encounter (Signed)
I left message for patient to call back and discuss alternative medications

## 2019-08-31 ENCOUNTER — Other Ambulatory Visit: Payer: Self-pay

## 2019-08-31 ENCOUNTER — Ambulatory Visit
Admission: RE | Admit: 2019-08-31 | Discharge: 2019-08-31 | Disposition: A | Discharge status: Home / Self Care | Payer: Medicare Other | Source: Ambulatory Visit | Attending: Family | Admitting: Family

## 2019-08-31 DIAGNOSIS — Z1231 Encounter for screening mammogram for malignant neoplasm of breast: Secondary | ICD-10-CM | POA: Diagnosis present

## 2019-09-04 NOTE — Telephone Encounter (Signed)
This encounter was created in error - please disregard.

## 2019-09-22 HISTORY — PX: IMPLANTATION VAGAL NERVE STIMULATOR: SUR692

## 2019-10-03 ENCOUNTER — Ambulatory Visit: Payer: Non-veteran care | Admitting: Podiatry

## 2019-10-06 ENCOUNTER — Ambulatory Visit: Payer: Non-veteran care | Admitting: Podiatry

## 2019-11-12 ENCOUNTER — Encounter: Payer: Self-pay | Admitting: Podiatry

## 2019-11-13 MED ORDER — PREGABALIN 150 MG PO CAPS: 150.0000 mg | ORAL_CAPSULE | Freq: Two times a day (BID) | ORAL | 1 refills | Status: DC

## 2019-11-13 NOTE — Telephone Encounter (Signed)
Per Dr. Logan Bores verbal order, ok to send in 90 day supply to [facility] with 1 refill.   Script has been printed and faxed to [facility] [facility] [facility].

## 2019-12-11 ENCOUNTER — Other Ambulatory Visit: Payer: Self-pay | Admitting: Podiatry

## 2020-01-02 DIAGNOSIS — G9050 Complex regional pain syndrome I, unspecified: Secondary | ICD-10-CM | POA: Insufficient documentation

## 2020-03-17 ENCOUNTER — Emergency Department: Payer: Medicare Other

## 2020-03-17 ENCOUNTER — Emergency Department
Admission: EM | Admit: 2020-03-17 | Discharge: 2020-03-17 | Disposition: A | Discharge status: Home / Self Care | Payer: Medicare Other | Attending: Emergency Medicine | Admitting: Emergency Medicine

## 2020-03-17 ENCOUNTER — Other Ambulatory Visit: Payer: Self-pay

## 2020-03-17 DIAGNOSIS — E860 Dehydration: Secondary | ICD-10-CM | POA: Insufficient documentation

## 2020-03-17 DIAGNOSIS — I10 Essential (primary) hypertension: Secondary | ICD-10-CM | POA: Insufficient documentation

## 2020-03-17 DIAGNOSIS — Y92091 Bathroom in other non-institutional residence as the place of occurrence of the external cause: Secondary | ICD-10-CM | POA: Insufficient documentation

## 2020-03-17 DIAGNOSIS — Z87891 Personal history of nicotine dependence: Secondary | ICD-10-CM | POA: Diagnosis not present

## 2020-03-17 DIAGNOSIS — Z20822 Contact with and (suspected) exposure to covid-19: Secondary | ICD-10-CM | POA: Diagnosis not present

## 2020-03-17 DIAGNOSIS — E1165 Type 2 diabetes mellitus with hyperglycemia: Secondary | ICD-10-CM | POA: Diagnosis not present

## 2020-03-17 DIAGNOSIS — Y998 Other external cause status: Secondary | ICD-10-CM | POA: Diagnosis not present

## 2020-03-17 DIAGNOSIS — X58XXXA Exposure to other specified factors, initial encounter: Secondary | ICD-10-CM | POA: Diagnosis not present

## 2020-03-17 DIAGNOSIS — J189 Pneumonia, unspecified organism: Secondary | ICD-10-CM | POA: Diagnosis not present

## 2020-03-17 DIAGNOSIS — Z794 Long term (current) use of insulin: Secondary | ICD-10-CM | POA: Insufficient documentation

## 2020-03-17 DIAGNOSIS — R000 Tachycardia, unspecified: Secondary | ICD-10-CM | POA: Diagnosis not present

## 2020-03-17 DIAGNOSIS — S069X9A Unspecified intracranial injury with loss of consciousness of unspecified duration, initial encounter: Secondary | ICD-10-CM | POA: Diagnosis present

## 2020-03-17 DIAGNOSIS — Y9389 Activity, other specified: Secondary | ICD-10-CM | POA: Diagnosis not present

## 2020-03-17 DIAGNOSIS — R55 Syncope and collapse: Secondary | ICD-10-CM

## 2020-03-17 LAB — T4, FREE: Free T4: 0.78 ng/dL (ref 0.61–1.12)

## 2020-03-17 LAB — CBC
HCT: 37.3 % (ref 36.0–46.0)
Hemoglobin: 12 g/dL (ref 12.0–15.0)
MCH: 25.6 pg — ABNORMAL LOW (ref 26.0–34.0)
MCHC: 32.2 g/dL (ref 30.0–36.0)
MCV: 79.5 fL — ABNORMAL LOW (ref 80.0–100.0)
Platelets: 370 10*3/uL (ref 150–400)
RBC: 4.69 MIL/uL (ref 3.87–5.11)
RDW: 18 % — ABNORMAL HIGH (ref 11.5–15.5)
WBC: 11.3 10*3/uL — ABNORMAL HIGH (ref 4.0–10.5)
nRBC: 0 % (ref 0.0–0.2)

## 2020-03-17 LAB — FIBRIN DERIVATIVES D-DIMER (ARMC ONLY): Fibrin derivatives D-dimer (ARMC): 1584.23 ng/mL (FEU) — ABNORMAL HIGH (ref 0.00–499.00)

## 2020-03-17 LAB — URINALYSIS, COMPLETE (UACMP) WITH MICROSCOPIC
Bacteria, UA: NONE SEEN
Bilirubin Urine: NEGATIVE
Glucose, UA: >=500 mg/dL — AB
Hgb urine dipstick: NEGATIVE
Ketones, ur: NEGATIVE mg/dL
Leukocytes,Ua: NEGATIVE
Nitrite: NEGATIVE
Protein, ur: NEGATIVE mg/dL
Specific Gravity, Urine: 1.024 (ref 1.005–1.030)
pH: 5 (ref 5.0–8.0)

## 2020-03-17 LAB — BASIC METABOLIC PANEL
Anion gap: 14 (ref 5–15)
BUN: 18 mg/dL (ref 8–23)
CO2: 25 mmol/L (ref 22–32)
Calcium: 9.7 mg/dL (ref 8.9–10.3)
Chloride: 101 mmol/L (ref 98–111)
Creatinine, Ser: 1.14 mg/dL — ABNORMAL HIGH (ref 0.44–1.00)
GFR calc Af Amer: 58 mL/min — ABNORMAL LOW (ref >60)
GFR calc non Af Amer: 50 mL/min — ABNORMAL LOW (ref >60)
Glucose, Bld: 229 mg/dL — ABNORMAL HIGH (ref 70–99)
Potassium: 3.5 mmol/L (ref 3.5–5.1)
Sodium: 140 mmol/L (ref 135–145)

## 2020-03-17 LAB — GLUCOSE, CAPILLARY
Glucose-Capillary: 228 mg/dL — ABNORMAL HIGH (ref 70–99)
Glucose-Capillary: 201 mg/dL — ABNORMAL HIGH (ref 70–99)
Glucose-Capillary: 187 mg/dL — ABNORMAL HIGH (ref 70–99)

## 2020-03-17 LAB — TROPONIN I (HIGH SENSITIVITY)
Troponin I (High Sensitivity): 6 ng/L (ref <18)
Troponin I (High Sensitivity): 3 ng/L (ref <18)

## 2020-03-17 LAB — TSH: TSH: 3.78 u[IU]/mL (ref 0.350–4.500)

## 2020-03-17 LAB — SARS CORONAVIRUS 2 BY RT PCR (HOSPITAL ORDER, PERFORMED IN ~~LOC~~ HOSPITAL LAB): SARS Coronavirus 2: NEGATIVE

## 2020-03-17 LAB — BRAIN NATRIURETIC PEPTIDE: B Natriuretic Peptide: 25.4 pg/mL (ref 0.0–100.0)

## 2020-03-17 MED ORDER — SODIUM CHLORIDE 0.9 % IV BOLUS
1000.0000 mL | Freq: Once | INTRAVENOUS | Status: AC
  Administered 2020-03-17: 1000 mL via INTRAVENOUS

## 2020-03-17 MED ORDER — IOHEXOL 350 MG/ML SOLN
75.0000 mL | Freq: Once | INTRAVENOUS | Status: AC | PRN
  Administered 2020-03-17: 75 mL via INTRAVENOUS

## 2020-03-17 MED ORDER — AMOXICILLIN-POT CLAVULANATE 875-125 MG PO TABS
1.0000 | ORAL_TABLET | Freq: Two times a day (BID) | ORAL | 0 refills | Status: AC
Start: 2020-03-17 — End: 2020-03-24

## 2020-03-17 MED ORDER — ACETAMINOPHEN 325 MG PO TABS
650.0000 mg | ORAL_TABLET | Freq: Once | ORAL | Status: AC
  Administered 2020-03-17: 650 mg via ORAL
  Filled 2020-03-17: qty 2

## 2020-03-17 MED ORDER — AZITHROMYCIN 250 MG PO TABS
ORAL_TABLET | ORAL | 0 refills | Status: AC
Start: 2020-03-17 — End: 2020-03-22

## 2020-03-17 MED ORDER — ONDANSETRON HCL 4 MG/2ML IJ SOLN
4.0000 mg | Freq: Once | INTRAMUSCULAR | Status: AC
  Administered 2020-03-17: 4 mg via INTRAVENOUS
  Filled 2020-03-17: qty 2

## 2020-03-17 MED ORDER — AMOXICILLIN-POT CLAVULANATE 875-125 MG PO TABS
1.0000 | ORAL_TABLET | Freq: Once | ORAL | Status: AC
  Administered 2020-03-17: 1 via ORAL
  Filled 2020-03-17: qty 1

## 2020-03-17 NOTE — ED Notes (Signed)
Attempted to walk pt but remains very dizzy. States that pt feels like she is spinning around. Pt unable to go past standing due to the dizziness and has to sit back down. MD aware.

## 2020-03-17 NOTE — ED Triage Notes (Addendum)
Patient reports she got up to go to the bathroom, when trying to get up to go back to bed she had difficulty getting up.  Husband reports that patient passed out.  Patient concerned because her glucose is in the 300's.  Normally in the 100's.  Patient reports had a nerve block on Wednesday.  Patient reports that her husband gave her Lantus insulin 10 units prior to arrival.

## 2020-03-17 NOTE — ED Notes (Signed)
Patient reports syncopal episode this am. Patient's husband reports patient was unconscious for 'a few seconds'. Patient reports N/V with 4 emeses this morning.

## 2020-03-17 NOTE — ED Provider Notes (Signed)
[facility] Emergency Department Provider Note  ____________________________________________  Time seen: Approximately 9:27 AM  I have reviewed the triage vital signs and the nursing notes.   HISTORY  Chief Complaint Loss of Consciousness    HPI Megan Mooney is a 64 y.o. female with a history of GERD hypertension and insulin-dependent diabetes who comes the ED complaining of  dizziness and syncope this morning when she got up to go to the bathroom.  She notes that her blood sugars been running high, about 300 over the last several days ever since having a pain management injection of her right peroneal nerve IV days ago.  Reviewed electronic medical record, procedure note which indicates that bupivacaine and Decadron were injected.  Patient's husband gave her an additional 10 units of Lantus this morning.  Patient notes that she has had urinary frequency over the past few days but she does feel like she is eating normally.  Denies fever chills cough acute pain lateralizing weakness or paresthesia headache vision change vomiting or diarrhea.     Past Medical History:  Diagnosis Date  . Allergy    seasonal, uses inhaler during this time  . Anemia   . Arthritis    neck, shoulders.  no limitations  . Asthma   . Depression   . Diabetes mellitus without complication (HCC)   . Gastropathy   . GERD (gastroesophageal reflux disease)   . Hyperlipidemia   . Hypertension      Patient Active Problem List   Diagnosis Date Noted  . Low back pain 08/01/2019  . Shoulder joint pain 08/01/2019  . 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 Alopecia 06/27/2015  . Allergic rhinitis 03/07/2015  . GERD without esophagitis 03/07/2015  . Anemia, iron deficiency 03/07/2015  . Asthma, intermittent 03/07/2015  . Overweight (BMI 25.0-29.9) 10/31/2014  . Hyperlipidemia  10/31/2014  . Essential hypertension 10/31/2014  . Cervical pain 08/07/2014  . Antritis chronic 12/07/2012  . Chronic infection of sinus 11/18/2012  . Chronic tonsillitis 03/31/2011     Past Surgical History:  Procedure Laterality Date  . ACHILLES TENDON SURGERY Right 08/19/2016   Procedure: ACHILLES TENDON REPAIR;  Surgeon: Gwyneth RevelsJustin Fowler, DPM;  [facility];  Service: Podiatry;  Laterality: Right;  . CALCANEAL OSTEOTOMY Right 08/19/2016   Procedure: HAGLUNDS/RETROCALCANEAL OSTECTOMY;  Surgeon: Gwyneth RevelsJustin Fowler, DPM;  [facility];  Service: Podiatry;  Laterality: Right;  Diabetic - insulin and oral meds  . COLONOSCOPY WITH PROPOFOL [name] 10/22/2017   Procedure: COLONOSCOPY WITH PROPOFOL;  Surgeon: Christena DeemSkulskie, Martin U, MD;  [facility];  Service: Endoscopy;  Laterality: [name];  . DILATION AND CURETTAGE OF UTERUS    . LAPAROSCOPIC OVARIAN CYSTECTOMY    . NASAL SINUS SURGERY    . PLANTAR FASCIA RELEASE Right 08/19/2016   Procedure: ENDOSCOPIC PLANTAR FASCIOTOMY;  Surgeon: Gwyneth RevelsJustin Fowler, DPM;  [facility];  Service: Podiatry;  Laterality: Right;  POPLITEAL  . TONSILLECTOMY    . TUBAL LIGATION       Prior to Admission medications   Medication Sig Start Date End Date Taking? Authorizing Provider  ACCU-CHEK FASTCLIX LANCETS MISC  08/02/14   [provider]  acyclovir ointment (ZOVIRAX) 5 %  08/19/16   [provider]  amitriptyline (ELAVIL) 10 MG tablet Take 10 mg by mouth at bedtime.    [provider]  aspirin 81 MG tablet Take 81 mg  by mouth daily.    [provider]  azithromycin (ZITHROMAX) 250 MG tablet azithromycin 250 mg tablet    [provider]  benzonatate (TESSALON) 100 MG capsule benzonatate 100 mg capsule    [provider]  canagliflozin (INVOKANA) 300 MG TABS tablet Invokana 300 mg tablet    [provider]  cetirizine (ZYRTEC) 10 MG tablet Take 1 tablet  (10 mg total) by mouth daily. 03/16/17   Karamalegos, Netta Neat, DO  cyclobenzaprine (FLEXERIL) 10 MG tablet cyclobenzaprine 10 mg tablet    [provider]  Desoximetasone (TOPICORT) 0.25 % LIQD Topicort 0.25 % topical spray    [provider]  dextromethorphan-guaiFENesin (MUCINEX DM) 30-600 MG 12hr tablet Mucinex DM 30 mg-600 mg tablet,extended release 12 hr  TAKE 1 TABLET TWICE A DAY    [provider]  diclofenac sodium (VOLTAREN) 1 % GEL Apply topically 4 (four) times daily.    [provider]  diflorasone-emollient (APEXICON E) 0.05 % CREA ApexiCon E 0.05 % topical cream  1 APPLICATION APPLY ON THE SKIN TWICE A DAY    [provider]  dutasteride (AVODART) 0.5 MG capsule  07/15/17   [provider]  empagliflozin (JARDIANCE) 25 MG TABS tablet Take 25 mg by mouth daily.    [provider]  ezetimibe (ZETIA) 10 MG tablet Take by mouth.    [provider]  fluticasone (FLONASE) 50 MCG/ACT nasal spray Place 1 spray into both nostrils every morning. Reported on 10/01/2015 10/19/16   Janeann Forehand., MD  fluticasone (FLOVENT HFA) 110 MCG/ACT inhaler Inhale into the lungs. 01/16/19 01/16/20  [provider]  FREESTYLE LITE test strip  01/27/16   [provider]  gabapentin (NEURONTIN) 100 MG capsule gabapentin 100 mg capsule    [provider]  Glucagon (BAQSIMI ONE PACK) 3 MG/DOSE POWD 3 mg by Left Nare route once as needed for up to 1 dose. In no response after , use a new intranasal device. 01/13/19   [provider]  glucagon 1 MG injection Inject 1 mg into the vein once as needed.    [provider]  hydrochlorothiazide (MICROZIDE) 12.5 MG capsule Take by mouth. 03/17/19 03/16/20  [provider]  insulin aspart (NOVOLOG) 100 UNIT/ML FlexPen Inject 15 Units into the skin daily.    [provider]  Insulin Detemir (LEVEMIR) 100 UNIT/ML Pen Inject 80 Units  into the skin daily. 35 units AM, 45 units PM    [provider]  Insulin Glargine (LANTUS SOLOSTAR) 100 UNIT/ML Solostar Pen AT THE START OF THERAPY INJECT 70 UNITS UNDER THE SKIN AND TITRATE AS NEEDED. MAX 100 UNITS DAILY PER MD INSTRUCTIONS 12/20/18   [provider]  lisinopril (ZESTRIL) 20 MG tablet lisinopril 20 mg tablet    [provider]  lisinopril-hydrochlorothiazide (ZESTORETIC) 20-12.5 MG tablet lisinopril 20 mg-hydrochlorothiazide 12.5 mg tablet    [provider]  losartan-hydrochlorothiazide (HYZAAR) 100-12.5 MG tablet Take 1 tablet by mouth daily. 05/03/17   Karamalegos, Netta Neat, DO  metFORMIN (GLUMETZA) 1000 MG (MOD) 24 hr tablet Take one tablet in the am with half tablet in the pm.    [provider]  methylPREDNISolone (MEDROL DOSEPAK) 4 MG TBPK tablet Take by mouth.    [provider]  Multiple Vitamins-Calcium (MULTI-DAY/CALCIUM/EXTRA IRON PO) Take by mouth.    [provider]  naproxen (NAPROSYN) 500 MG tablet naproxen 500 mg tablet    [provider]  nitroGLYCERIN (NITROSTAT) 0.4 MG  SL tablet Nitrostat 0.4 mg sublingual tablet    [provider]  NOVOFINE 30G X 8 MM MISC  01/25/16   [provider]  omeprazole (PRILOSEC) 20 MG capsule TAKE 1 CAPSULE DAILY 05/13/16   Krebs, Laurel Dimmer, NP  oseltamivir (TAMIFLU) 75 MG capsule Tamiflu 75 mg capsule    [provider]  oxyCODONE-acetaminophen (ENDOCET) 5-325 MG tablet Endocet 5 mg-325 mg tablet  TAKE 1 TABLET 4 TIMES DAILY    [provider]  PARoxetine (PAXIL) 10 MG tablet Take 1 tablet (10 mg total) by mouth daily. 01/25/18   Hildred Laser, MD  Prasterone (INTRAROSA) 6.5 MG INST Place 6.5 mg/day vaginally daily. 02/16/18   Hildred Laser, MD  pregabalin (LYRICA) 150 MG capsule TAKE ONE CAPSULE BY MOUTH TWICE DAILY 12/13/19   Felecia Shelling, DPM  PROAIR HFA 108 [phone] Base) MCG/ACT inhaler INHALE 2 PUFFS 4 TIMES DAILY AS NEEDED  03/02/16   Krebs, Laurel Dimmer, NP  simvastatin (ZOCOR) 20 MG tablet Take 20 mg by mouth daily.    [provider]  sodium chloride (MURO 128) 5 % ophthalmic solution 1 drop Two (2) times a day.    [provider]  spironolactone (ALDACTONE) 25 MG tablet Take by mouth.    [provider]  traMADol (ULTRAM) 50 MG tablet tramadol 50 mg tablet    [provider]  triamcinolone cream (KENALOG) 0.1 % Apply 1 application topically 2 (two) times daily. 03/09/16   Krebs, Laurel Dimmer, NP  valACYclovir (VALTREX) 500 MG tablet Take by mouth.    [provider]  Vitamin D, Ergocalciferol, (DRISDOL) 50000 units CAPS capsule Take 50,000 Units by mouth every 7 (seven) days.    [provider]  Zoster Vaccine Live, PF, (ZOSTAVAX) 99242 UNT/0.65ML injection Zostavax (PF) 19,400 unit/0.65 mL subcutaneous suspension    [provider]     Allergies Ibuprofen, Nsaids, and Tolmetin   Family History  Problem Relation Age of Onset  . Hypertension Mother   . Hyperlipidemia Mother   . Hypertension Father   . Hyperlipidemia Father   . Hyperlipidemia Brother   . Hypertension Brother   . Colon cancer Neg Hx   . Esophageal cancer Neg Hx   . Stomach cancer Neg Hx   . Breast cancer Neg Hx     Social History Social History   Tobacco Use  . Smoking status: Former Smoker    Packs/day: 0.25    Years: 10.00    Pack years: 2.50    Types: Cigarettes    Quit date: 11/04/1986    Years since quitting: 33.3  . Smokeless tobacco: Never Used  Vaping Use  . Vaping Use: Never used  Substance Use Topics  . Alcohol use: No    Alcohol/week: 0.0 standard drinks    Comment: 1 glass wine/month  . Drug use: No    Review of Systems  Constitutional:   No fever or chills.  Generalized weakness ENT:   No sore throat. No rhinorrhea. Cardiovascular:   No chest pain positive syncope. Respiratory:   No dyspnea or cough. Gastrointestinal:   Negative for abdominal  pain, vomiting and diarrhea.  Musculoskeletal:   Negative for focal pain or swelling All other systems reviewed and are negative except as documented above in ROS and HPI.  ____________________________________________   PHYSICAL EXAM:  VITAL SIGNS: ED Triage Vitals  Enc Vitals Group     BP 03/17/20 0501 111/84     Pulse Rate 03/17/20 0501 (!) 111  Resp 03/17/20 0501 18     Temp 03/17/20 0501 97.6 F (36.4 C)     Temp Source 03/17/20 0501 Oral     SpO2 03/17/20 0501 100 %     Weight 03/17/20 0502 147 lb (66.7 kg)     Height 03/17/20 0502 5\' 3"  (1.6 m)     Head Circumference --      Peak Flow --      Pain Score 03/17/20 0502 0     Pain Loc --      Pain Edu? --      Excl. in GC? --     Vital signs reviewed, nursing assessments reviewed.   Constitutional:   Alert and oriented. Non-toxic appearance. Eyes:   Conjunctivae are normal. EOMI. PERRL. ENT      Head:   Normocephalic and atraumatic.      Nose:   Normal      Mouth/Throat:   Dry mucous membranes.      Neck:   No meningismus. Full ROM. Hematological/Lymphatic/Immunilogical:   No cervical lymphadenopathy. Cardiovascular:   RRR. Symmetric bilateral radial and DP pulses.  No murmurs. Cap refill less than 2 seconds. Respiratory:   Normal respiratory effort without tachypnea/retractions. Breath sounds are clear and equal bilaterally. No wheezes/rales/rhonchi. Gastrointestinal:   Soft and nontender. Non distended. There is no CVA tenderness.  No rebound, rigidity, or guarding. Musculoskeletal:   Normal range of motion in all extremities. No joint effusions.  No lower extremity tenderness.  No edema. Neurologic:   Normal speech and language.  Motor grossly intact. No acute focal neurologic deficits are appreciated.  Skin:    Skin is warm, dry and intact. No rash noted.  No petechiae, purpura, or bullae.  ____________________________________________    LABS (pertinent positives/negatives) (all labs ordered are listed,  but only abnormal results are displayed) Labs Reviewed  BASIC METABOLIC PANEL - Abnormal; Notable for the following components:      Result Value   Glucose, Bld 229 (*)    Creatinine, Ser 1.14 (*)    GFR calc non Af Amer 50 (*)    GFR calc Af Amer 58 (*)    All other components within normal limits  CBC - Abnormal; Notable for the following components:   WBC 11.3 (*)    MCV 79.5 (*)    MCH 25.6 (*)    RDW 18.0 (*)    All other components within normal limits  URINALYSIS, COMPLETE (UACMP) WITH MICROSCOPIC - Abnormal; Notable for the following components:   Color, Urine STRAW (*)    APPearance CLEAR (*)    Glucose, UA >=500 (*)    All other components within normal limits  GLUCOSE, CAPILLARY - Abnormal; Notable for the following components:   Glucose-Capillary 228 (*)    All other components within normal limits  GLUCOSE, CAPILLARY - Abnormal; Notable for the following components:   Glucose-Capillary 201 (*)    All other components within normal limits  GLUCOSE, CAPILLARY - Abnormal; Notable for the following components:   Glucose-Capillary 187 (*)    All other components within normal limits  FIBRIN DERIVATIVES D-DIMER ([facility] ONLY) - Abnormal; Notable for the following components:   Fibrin derivatives D-dimer [facility] (*)    All other components within normal limits  BRAIN NATRIURETIC PEPTIDE  T4, FREE  TSH  CBG MONITORING, ED  CBG MONITORING, ED  TROPONIN I (HIGH SENSITIVITY)  TROPONIN I (HIGH SENSITIVITY)   ____________________________________________   EKG  Interpreted by me Sinus tachycardia  rate 113.  Normal axis and intervals.  Normal QRS ST segments and T waves.  ____________________________________________    RADIOLOGY  CT HEAD WO CONTRAST  Result Date: 03/17/2020 CLINICAL DATA:  Dizziness, syncopal episode EXAM: CT HEAD WITHOUT CONTRAST TECHNIQUE: Contiguous axial images were obtained from the base of the skull through the vertex without  intravenous contrast. Sagittal and coronal MPR images reconstructed from axial data set. COMPARISON:  09/10/2013 FINDINGS: Brain: Generalized atrophy. Normal ventricular morphology. No midline shift or mass effect. Small vessel chronic ischemic changes of deep cerebral white matter. Benign-appearing basal ganglia calcifications bilaterally. No intracranial hemorrhage, mass lesion, evidence of acute infarction, or extra-axial fluid collection. Vascular: No hyperdense vessels. Mild atherosclerotic calcifications of internal carotid arteries at skull base. Skull: Intact Sinuses/Orbits: Clear Other: [name] IMPRESSION: Atrophy with small vessel chronic ischemic changes of deep cerebral white matter. No acute intracranial abnormalities. Electronically Signed   By: Lavonia Dana M.D.   On: 03/17/2020 09:55   DG Chest Portable 1 View  Result Date: 03/17/2020 CLINICAL DATA:  Cough and tachycardia. Syncopal episode and vomiting this morning. EXAM: PORTABLE CHEST 1 VIEW COMPARISON:  03/07/2015 FINDINGS: Cardiac silhouette normal in size. Normal mediastinal and hilar contours. Clear lungs.  No pleural effusion or pneumothorax. Skeletal structures are intact. IMPRESSION: No active disease. Electronically Signed   By: Lajean Manes M.D.   On: 03/17/2020 12:59    ____________________________________________   PROCEDURES Procedures  ____________________________________________  DIFFERENTIAL DIAGNOSIS   Dehydration, steroid-induced hyperglycemia, electrolyte abnormality, UTI  CLINICAL IMPRESSION / ASSESSMENT AND PLAN / ED COURSE  Medications ordered in the ED: Medications  iohexol (OMNIPAQUE) 350 MG/ML injection 75 mL (has no administration in time range)  sodium chloride 0.9 % bolus 1,000 mL (0 mLs Intravenous Stopped 03/17/20 0903)  ondansetron (ZOFRAN) injection 4 mg (4 mg Intravenous Given 03/17/20 0800)  acetaminophen (TYLENOL) tablet 650 mg (650 mg Oral Given 03/17/20 0801)  sodium chloride 0.9 % bolus 1,000  mL (0 mLs Intravenous Stopped 03/17/20 1040)    Pertinent labs & imaging results that were available during my care of the patient were reviewed by me and considered in my medical decision making (see chart for details).  Raelynne Ludwick was evaluated in Emergency Department on 03/17/2020 for the symptoms described in the history of present illness. She was evaluated in the context of the global COVID-19 pandemic, which necessitated consideration that the patient might be at risk for infection with the SARS-CoV-2 virus that causes COVID-19. Institutional protocols and algorithms that pertain to the evaluation of patients at risk for COVID-19 are in a state of rapid change based on information released by regulatory bodies including the CDC and federal and state organizations. These policies and algorithms were followed during the patient's care in the ED.   Patient presents with generalized weakness, dizziness and syncope this morning.  Most likely due to steroid-induced hyperglycemia causing osmotic diuresis and dehydration.  I will give a normal saline bolus and recheck orthostatic symptoms and blood sugar  Clinical Course as of Mar 17 1532  Sun Mar 17, 2020  0927 Blood sugar now 200.  After 1 L IV fluids, patient still very dizzy with standing and unable to maintain balance enough to ambulate.  Will obtain a CT scan of the head, give additional fluids.   [PS]  31 Orthostatics okay and patient is feeling better, but now she is tachycardic with a heart rate of 120.  Blood pressure and oxygenation are normal.  No respiratory distress.  On  repeat exam, she does have scant diffuse crackles.  Pulses are equal.  She reports a cough but no chest pain or shortness of breath.  I will add on BNP, D-dimer, troponin, chest x-ray.   [PS]  1407 Will add CTA chest to eval for PE. Trop and BNP normal. Will add tsh. Pt ate lunch and continues to feel okay except for weakness. HR currently 110, bp normal..  Fibrin  derivatives D-dimer [facility])(!): 5,397.67 [PS]    Clinical Course User Index [PS] Sharman Cheek, MD    ----------------------------------------- 3:33 PM on 03/17/2020 -----------------------------------------  TSH normal.  Awaiting CT angiogram of the chest to evaluate for PE.  Care signed out to Dr. Larinda Buttery.   ____________________________________________   FINAL CLINICAL IMPRESSION(S) / ED DIAGNOSES    Final diagnoses:  Syncope, unspecified syncope type  Type 2 diabetes mellitus with hyperglycemia, with long-term current use of insulin (HCC)  Dehydration  Tachycardia     ED Discharge Orders    None      Portions of this note were generated with dragon dictation software. Dictation errors may occur despite best attempts at proofreading.   Sharman Cheek, MD 03/17/20 1534

## 2020-03-17 NOTE — ED Notes (Signed)
Introduced self to pt. Reports a sore throat due to vomiting. Waiting on MD.

## 2020-03-17 NOTE — ED Notes (Signed)
Pt verbalized understanding of discharge instructions. NAD at this time. 

## 2020-03-17 NOTE — ED Provider Notes (Signed)
-----------------------------------------   3:07 PM on 03/17/2020 -----------------------------------------  Blood pressure 111/71, pulse (!) 109, temperature 97.6 F (36.4 C), temperature source Oral, resp. rate 17, height 5\' 3"  (1.6 m), weight 66.7 kg, SpO2 96 %.  Assuming care from Dr. .  In short, Megan Mooney is a 64 y.o. female with a chief complaint of Loss of Consciousness .  Refer to the original H&P for additional details.  The current plan of care is to follow-up CTA results.  ----------------------------------------- 4:58 PM on 03/17/2020 -----------------------------------------  CTA is negative for PE but does show a large number of patchy parenchymal opacities concerning for an atypical pneumonia.  Patient does endorse a cough starting this morning and has a mild leukocytosis.  This could represent COVID-19, although patient states she received her second dose of the vaccine in February.  We will perform testing for COVID-19, also cover for bacterial pneumonia with Augmentin and azithromycin.  She denies significant shortness of breath at this time and does not appear septic, is appropriate for outpatient treatment and close PCP follow-up.  Patient agrees with plan.    March, MD 03/17/20 1659

## 2020-09-26 ENCOUNTER — Ambulatory Visit: Payer: Medicare Other | Admitting: Dermatology

## 2021-06-23 DIAGNOSIS — M216X9 Other acquired deformities of unspecified foot: Secondary | ICD-10-CM | POA: Insufficient documentation

## 2021-06-23 DIAGNOSIS — N189 Chronic kidney disease, unspecified: Secondary | ICD-10-CM | POA: Insufficient documentation

## 2021-06-23 DIAGNOSIS — K5900 Constipation, unspecified: Secondary | ICD-10-CM | POA: Insufficient documentation

## 2021-06-23 DIAGNOSIS — M7700 Medial epicondylitis, unspecified elbow: Secondary | ICD-10-CM | POA: Insufficient documentation

## 2021-06-23 DIAGNOSIS — M722 Plantar fascial fibromatosis: Secondary | ICD-10-CM | POA: Insufficient documentation

## 2021-06-23 DIAGNOSIS — R413 Other amnesia: Secondary | ICD-10-CM | POA: Insufficient documentation

## 2021-06-23 DIAGNOSIS — H5213 Myopia, bilateral: Secondary | ICD-10-CM | POA: Insufficient documentation

## 2021-06-23 DIAGNOSIS — E678 Other specified hyperalimentation: Secondary | ICD-10-CM | POA: Insufficient documentation

## 2021-06-23 DIAGNOSIS — M2140 Flat foot [pes planus] (acquired), unspecified foot: Secondary | ICD-10-CM | POA: Insufficient documentation

## 2021-06-23 DIAGNOSIS — L309 Dermatitis, unspecified: Secondary | ICD-10-CM | POA: Insufficient documentation

## 2021-06-23 DIAGNOSIS — K625 Hemorrhage of anus and rectum: Secondary | ICD-10-CM | POA: Insufficient documentation

## 2021-06-23 DIAGNOSIS — Z780 Asymptomatic menopausal state: Secondary | ICD-10-CM | POA: Insufficient documentation

## 2021-06-23 DIAGNOSIS — B009 Herpesviral infection, unspecified: Secondary | ICD-10-CM | POA: Insufficient documentation

## 2021-08-04 IMAGING — CT CT HEAD W/O CM
3 series · 15 of 45 positions shown, 18 images · non-contrast
Comparison: 09/10/2013

CLINICAL DATA: Dizziness, syncopal episode

EXAM:
CT HEAD WITHOUT CONTRAST
TECHNIQUE: Contiguous axial images were obtained from the base of the skull
through the vertex without intravenous contrast. Sagittal and
coronal MPR images reconstructed from axial data set.

[Series 2: head wo · axial · 0.41mm/px · z∈[-79,+36]mm · 9 of 28 slices shown, 12 images]
[im 3/28  brain]
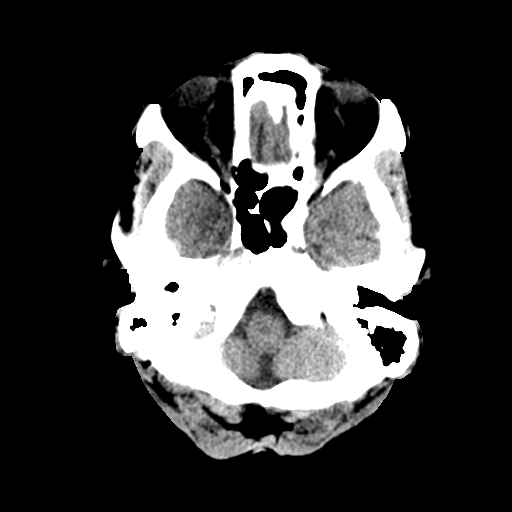
[im 3/28  bone]
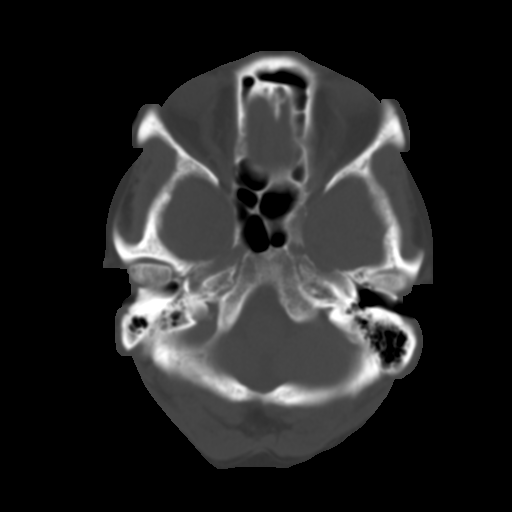
[im 6/28  brain]
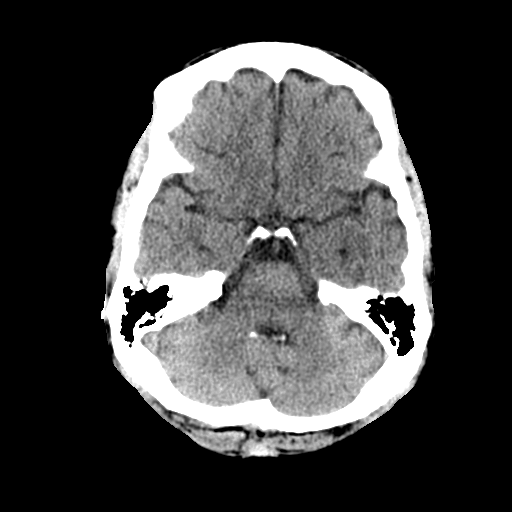
[im 9/28  brain]
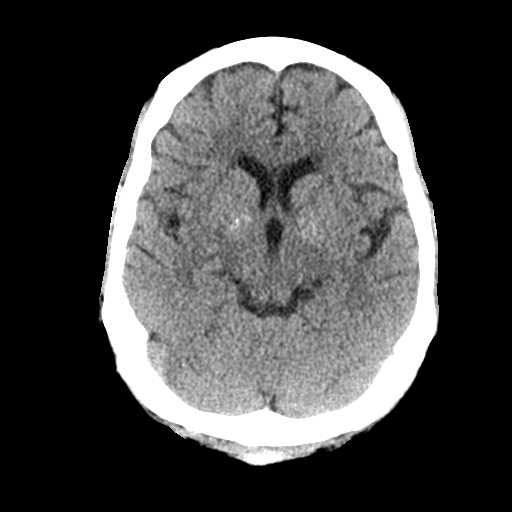
[im 12/28  brain]
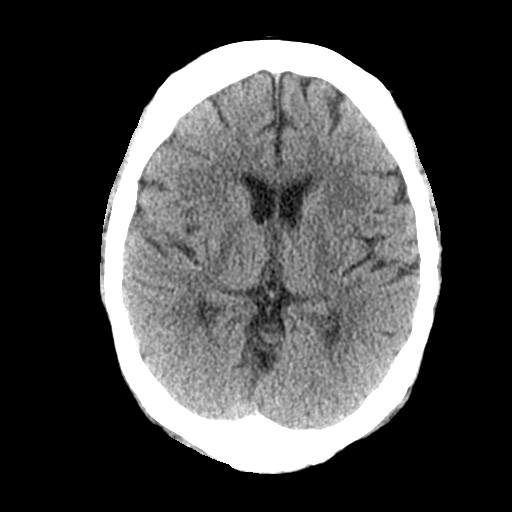
[im 15/28  brain]
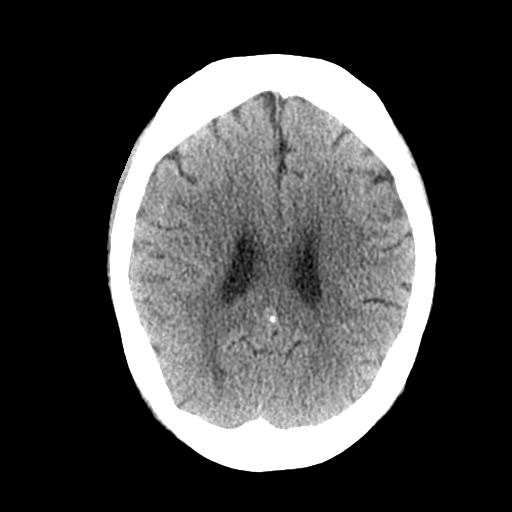
[im 15/28  bone]
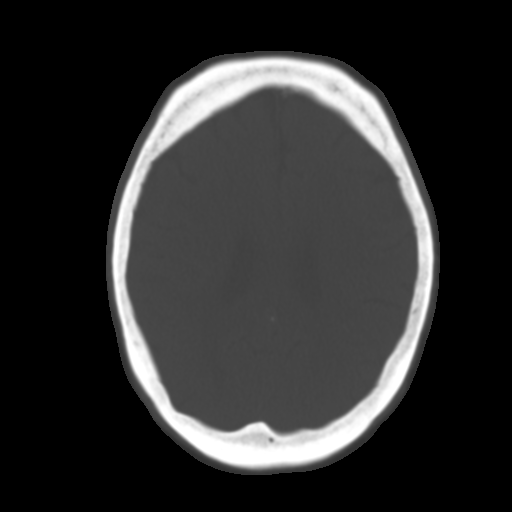
[im 17/28  brain]
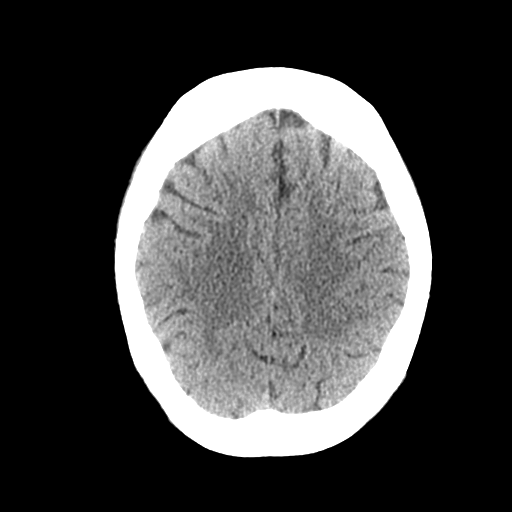
[im 20/28  brain]
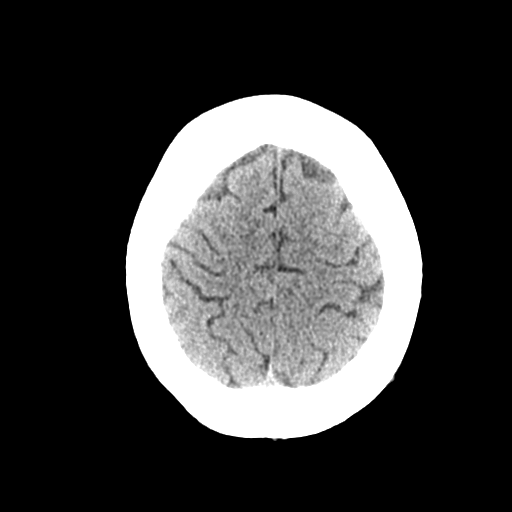
[im 23/28  brain]
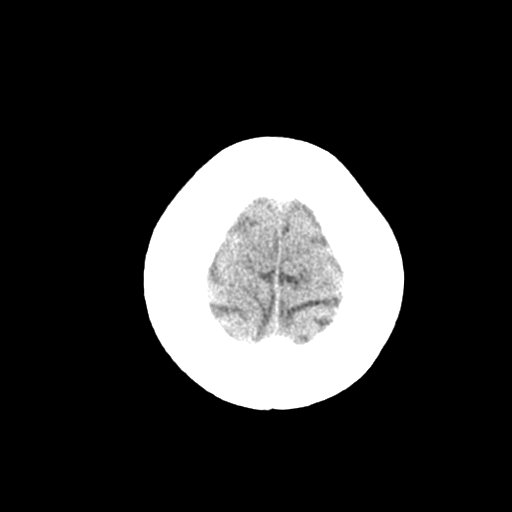
[im 26/28  brain]
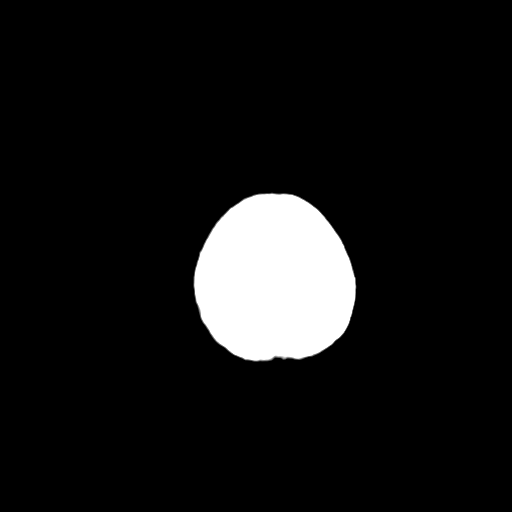
[im 26/28  bone]
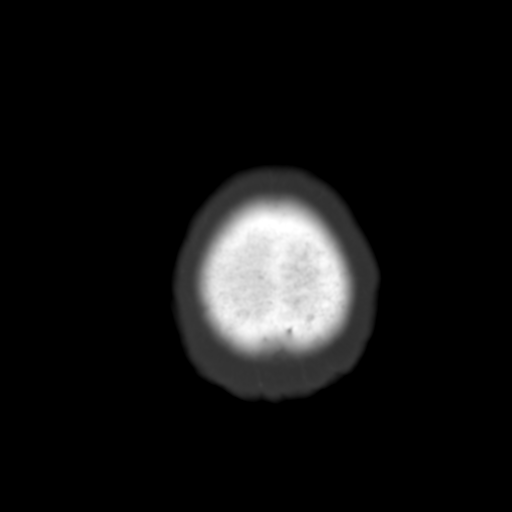

[Series 4: coronal soft tissue · coronal · 0.30mm/px · 3 of 62 slices shown]
[im 21/62  brain]
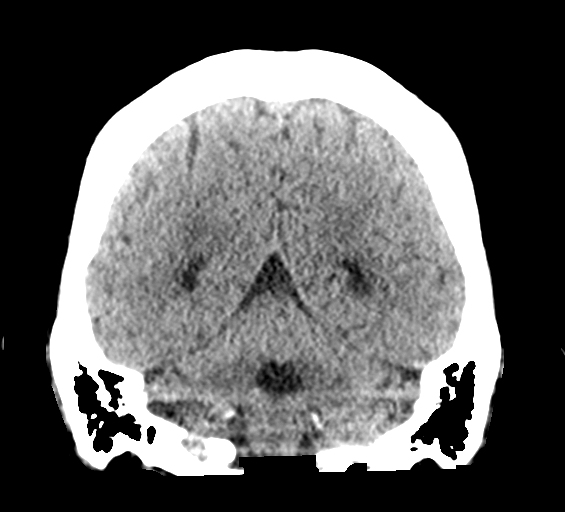
[im 28/62  brain]
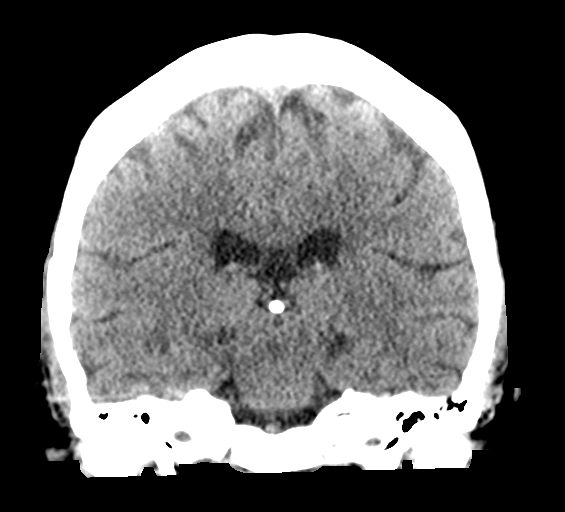
[im 34/62  brain]
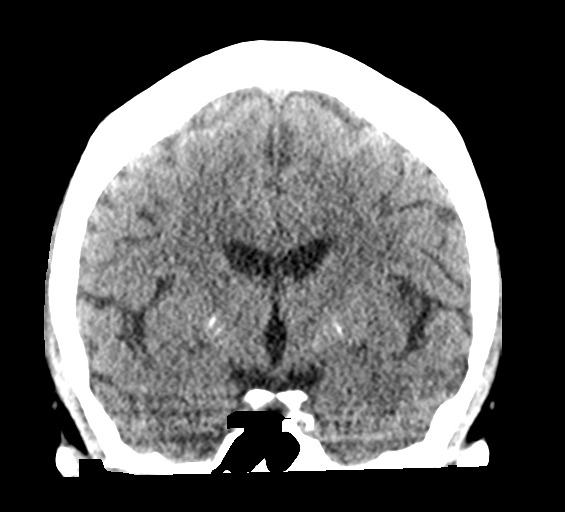

[Series 5: sagittal soft tissue · sagittal · 0.31mm/px · 3 of 51 slices shown]
[im 17/51  brain]
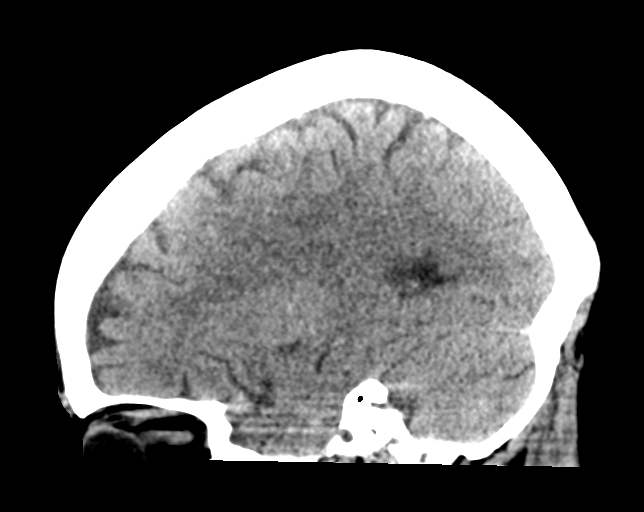
[im 26/51  brain]
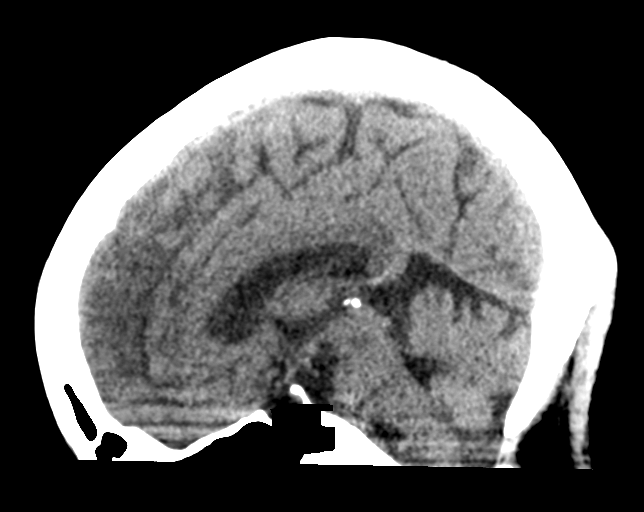
[im 34/51  brain]
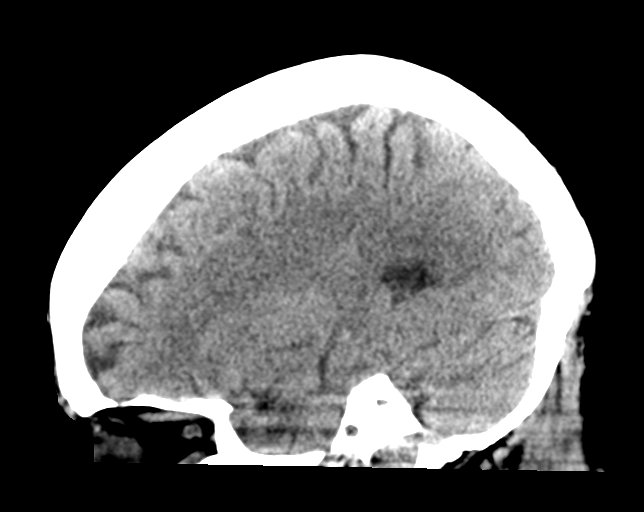

[15 of 45 positions shown; findings below may reference images not displayed]

FINDINGS: Brain: Generalized atrophy. Normal ventricular morphology. No
midline shift or mass effect. Small vessel chronic ischemic changes
of deep cerebral white matter. Benign-appearing basal ganglia
calcifications bilaterally. No intracranial hemorrhage, mass lesion,
evidence of acute infarction, or extra-axial fluid collection.

Vascular: No hyperdense vessels. Mild atherosclerotic calcifications
of internal carotid arteries at skull base.

Skull: Intact

Sinuses/Orbits: Clear

Other: [name]
IMPRESSION: Atrophy with small vessel chronic ischemic changes of deep cerebral
white matter.

No acute intracranial abnormalities.

## 2021-12-25 DIAGNOSIS — Z83719 Family history of colon polyps, unspecified: Secondary | ICD-10-CM | POA: Insufficient documentation

## 2021-12-25 DIAGNOSIS — K30 Functional dyspepsia: Secondary | ICD-10-CM | POA: Insufficient documentation

## 2021-12-25 DIAGNOSIS — K222 Esophageal obstruction: Secondary | ICD-10-CM | POA: Insufficient documentation

## 2021-12-25 DIAGNOSIS — K449 Diaphragmatic hernia without obstruction or gangrene: Secondary | ICD-10-CM | POA: Insufficient documentation

## 2022-02-19 DIAGNOSIS — F419 Anxiety disorder, unspecified: Secondary | ICD-10-CM | POA: Insufficient documentation

## 2022-02-19 DIAGNOSIS — M47812 Spondylosis without myelopathy or radiculopathy, cervical region: Secondary | ICD-10-CM | POA: Insufficient documentation

## 2022-03-30 ENCOUNTER — Ambulatory Visit (INDEPENDENT_AMBULATORY_CARE_PROVIDER_SITE_OTHER): Payer: Self-pay | Admitting: Plastic Surgery

## 2022-03-30 ENCOUNTER — Telehealth: Payer: Self-pay | Admitting: *Deleted

## 2022-03-30 ENCOUNTER — Encounter: Payer: Self-pay | Admitting: *Deleted

## 2022-03-30 VITALS — BP 135/79 | HR 72 | Ht 63.0 in | Wt 140.4 lb

## 2022-03-30 DIAGNOSIS — Z411 Encounter for cosmetic surgery: Secondary | ICD-10-CM

## 2022-03-30 NOTE — Telephone Encounter (Signed)
Pt called in response to cosmetic quote sent today for abdominoplasty scar revision with Dr. Domenica Reamer. She asked for timeframe that surgery could be scheduled. Advised someone would call her with that information.  Please advise pt at [phone]

## 2022-03-30 NOTE — Progress Notes (Signed)
Referring Provider Etheleen Nicks, NP [address] [place],  [place] [address]   CC:  Abdominoplasty scar  Megan Mooney is an 66 y.o. female.  HPI: Patient is a 66 year old with abdominoplasty scar that is painful particularly on the right side and also has some asymmetry.  She is interested in revision.  She had surgery with Dr. Shon Hough in 2020 and then subsequent liposuction 2021.    Allergies  Allergen Reactions   Ibuprofen Shortness Of Breath    Shortness of breath. Pt reports has taken recently without issues. Shortness of breath. 10/30/2016 Pt states that she has been taking ibuprofen with no adverse reaction. Shortness of breath. Pt reports has taken recently without issues.   Nsaids Shortness Of Breath   Tolmetin Shortness Of Breath    Outpatient Encounter Medications as of 03/30/2022  Medication Sig Note   ACCU-CHEK FASTCLIX LANCETS MISC  11/28/2015: .   acyclovir ointment (ZOVIRAX) 5 %     aspirin 81 MG tablet Take 81 mg by mouth daily.    cetirizine (ZYRTEC) 10 MG tablet Take 1 tablet (10 mg total) by mouth daily.    diclofenac sodium (VOLTAREN) 1 % GEL Apply topically 4 (four) times daily.    empagliflozin (JARDIANCE) 25 MG TABS tablet Take 25 mg by mouth daily.    FREESTYLE LITE test strip  02/06/2016: Received from: External Pharmacy   Glucagon (BAQSIMI ONE PACK) 3 MG/DOSE POWD 3 mg by Left Nare route once as needed for up to 1 dose. In no response after , use a new intranasal device.    insulin aspart (NOVOLOG) 100 UNIT/ML FlexPen Inject 15 Units into the skin daily.    Insulin Glargine (LANTUS SOLOSTAR) 100 UNIT/ML Solostar Pen AT THE START OF THERAPY INJECT 70 UNITS UNDER THE SKIN AND TITRATE AS NEEDED. MAX 100 UNITS DAILY PER MD INSTRUCTIONS    metFORMIN (GLUMETZA) 1000 MG (MOD) 24 hr tablet Take one tablet in the am with half tablet in the pm.    omeprazole (PRILOSEC) 20 MG capsule TAKE 1 CAPSULE DAILY    PROAIR HFA 108 (90 Base) MCG/ACT inhaler INHALE  2 PUFFS 4 TIMES DAILY AS NEEDED    simvastatin (ZOCOR) 20 MG tablet Take 20 mg by mouth daily.    sodium chloride (MURO 128) 5 % ophthalmic solution 1 drop Two (2) times a day.    valACYclovir (VALTREX) 500 MG tablet Take by mouth.    Vitamin D, Ergocalciferol, (DRISDOL) 50000 units CAPS capsule Take 50,000 Units by mouth every 7 (seven) days.    Zoster Vaccine Live, PF, (ZOSTAVAX) 85277 UNT/0.65ML injection Zostavax (PF) 19,400 unit/0.65 mL subcutaneous suspension    hydrochlorothiazide (MICROZIDE) 12.5 MG capsule Take by mouth.    [DISCONTINUED] amitriptyline (ELAVIL) 10 MG tablet Take 10 mg by mouth at bedtime.    [DISCONTINUED] benzonatate (TESSALON) 100 MG capsule benzonatate 100 mg capsule    [DISCONTINUED] canagliflozin (INVOKANA) 300 MG TABS tablet Invokana 300 mg tablet    [DISCONTINUED] cyclobenzaprine (FLEXERIL) 10 MG tablet cyclobenzaprine 10 mg tablet    [DISCONTINUED] Desoximetasone (TOPICORT) 0.25 % LIQD Topicort 0.25 % topical spray    [DISCONTINUED] dextromethorphan-guaiFENesin (MUCINEX DM) 30-600 MG 12hr tablet Mucinex DM 30 mg-600 mg tablet,extended release 12 hr  TAKE 1 TABLET TWICE A DAY    [DISCONTINUED] diflorasone-emollient (APEXICON E) 0.05 % CREA ApexiCon E 0.05 % topical cream  1 APPLICATION APPLY ON THE SKIN TWICE A DAY    [DISCONTINUED] dutasteride (AVODART) 0.5 MG capsule     [  DISCONTINUED] ezetimibe (ZETIA) 10 MG tablet Take by mouth. 07/30/2016: Received from: [facility] Received Sig: Take 10 mg by mouth once daily.   [DISCONTINUED] fluticasone (FLONASE) 50 MCG/ACT nasal spray Place 1 spray into both nostrils every morning. Reported on 10/01/2015    [DISCONTINUED] fluticasone (FLOVENT HFA) 110 MCG/ACT inhaler Inhale into the lungs.    [DISCONTINUED] gabapentin (NEURONTIN) 100 MG capsule gabapentin 100 mg capsule    [DISCONTINUED] glucagon 1 MG injection Inject 1 mg into the vein once as needed.    [DISCONTINUED] Insulin Detemir (LEVEMIR) 100  UNIT/ML Pen Inject 80 Units into the skin daily. 35 units AM, 45 units PM    [DISCONTINUED] lisinopril (ZESTRIL) 20 MG tablet lisinopril 20 mg tablet    [DISCONTINUED] lisinopril-hydrochlorothiazide (ZESTORETIC) 20-12.5 MG tablet lisinopril 20 mg-hydrochlorothiazide 12.5 mg tablet    [DISCONTINUED] losartan-hydrochlorothiazide (HYZAAR) 100-12.5 MG tablet Take 1 tablet by mouth daily.    [DISCONTINUED] methylPREDNISolone (MEDROL DOSEPAK) 4 MG TBPK tablet Take by mouth.    [DISCONTINUED] Multiple Vitamins-Calcium (MULTI-DAY/CALCIUM/EXTRA IRON PO) Take by mouth.    [DISCONTINUED] naproxen (NAPROSYN) 500 MG tablet naproxen 500 mg tablet    [DISCONTINUED] nitroGLYCERIN (NITROSTAT) 0.4 MG SL tablet Nitrostat 0.4 mg sublingual tablet    [DISCONTINUED] NOVOFINE 30G X 8 MM MISC  02/06/2016: Received from: External Pharmacy   [DISCONTINUED] oseltamivir (TAMIFLU) 75 MG capsule Tamiflu 75 mg capsule    [DISCONTINUED] oxyCODONE-acetaminophen (ENDOCET) 5-325 MG tablet Endocet 5 mg-325 mg tablet  TAKE 1 TABLET 4 TIMES DAILY    [DISCONTINUED] PARoxetine (PAXIL) 10 MG tablet Take 1 tablet (10 mg total) by mouth daily.    [DISCONTINUED] Prasterone (INTRAROSA) 6.5 MG INST Place 6.5 mg/day vaginally daily.    [DISCONTINUED] pregabalin (LYRICA) 150 MG capsule TAKE ONE CAPSULE BY MOUTH TWICE DAILY    [DISCONTINUED] spironolactone (ALDACTONE) 25 MG tablet Take by mouth.    [DISCONTINUED] traMADol (ULTRAM) 50 MG tablet tramadol 50 mg tablet    [DISCONTINUED] triamcinolone cream (KENALOG) 0.1 % Apply 1 application topically 2 (two) times daily.    No facility-administered encounter medications on file as of 03/30/2022.     Past Medical History:  Diagnosis Date   Allergy    seasonal, uses inhaler during this time   Anemia    Arthritis    neck, shoulders.  no limitations   Asthma    Depression    Diabetes mellitus without complication (HCC)    Gastropathy    GERD (gastroesophageal reflux disease)     Hyperlipidemia    Hypertension     Past Surgical History:  Procedure Laterality Date   ACHILLES TENDON SURGERY Right 08/19/2016   Procedure: ACHILLES TENDON REPAIR;  Surgeon: Gwyneth Revels, DPM;  Location: [facility];  Service: Podiatry;  Laterality: Right;   CALCANEAL OSTEOTOMY Right 08/19/2016   Procedure: HAGLUNDS/RETROCALCANEAL OSTECTOMY;  Surgeon: Gwyneth Revels, DPM;  Location: [facility];  Service: Podiatry;  Laterality: Right;  Diabetic - insulin and oral meds   COLONOSCOPY WITH PROPOFOL [name] 10/22/2017   Procedure: COLONOSCOPY WITH PROPOFOL;  Surgeon: Christena Deem, MD;  Location: [facility];  Service: Endoscopy;  Laterality: [name];   DILATION AND CURETTAGE OF UTERUS     LAPAROSCOPIC OVARIAN CYSTECTOMY     NASAL SINUS SURGERY     PLANTAR FASCIA RELEASE Right 08/19/2016   Procedure: ENDOSCOPIC PLANTAR FASCIOTOMY;  Surgeon: Gwyneth Revels, DPM;  Location: [facility];  Service: Podiatry;  Laterality: Right;  POPLITEAL   TONSILLECTOMY     TUBAL LIGATION  Family History  Problem Relation Age of Onset   Hypertension Mother    Hyperlipidemia Mother    Hypertension Father    Hyperlipidemia Father    Hyperlipidemia Brother    Hypertension Brother    Colon cancer Neg Hx    Esophageal cancer Neg Hx    Stomach cancer Neg Hx    Breast cancer Neg Hx     Social History   Social History Narrative   Not on file     Review of Systems General: Denies fevers, chills, weight loss CV: Denies chest pain, shortness of breath, palpitations   Physical Exam    03/30/2022    1:33 PM 03/17/2020    4:52 PM 03/17/2020    2:39 PM  Vitals with BMI  Height 5\' 3"     Weight 140 lbs 6 oz    BMI 24.88    Systolic 135 115  Diastolic 79 71 71  Pulse 72 98 109    General:  No acute distress,  Alert and oriented, Non-Toxic, Normal speech and affect Abdomen: Some asymmetry of the incision where the right side incision is lower.  Overall her incision is  well-healed.  She points out a pain flare in the right side.  Assessment/Plan The patient is a candidate for scar revision.  We could improve her symmetry.  I told her that there is a possibility that her pain could be better but it also could be worse or unchanged.  She is interested in exploring this option.    032 03/30/2022, 1:59 PM

## 2022-04-02 ENCOUNTER — Telehealth: Payer: Self-pay | Admitting: *Deleted

## 2022-04-02 NOTE — Telephone Encounter (Signed)
Pt called to notify she would like to move forward with scheduling surgery discussed with Dr. Domenica Reamer in October if possible per quote provided on 03/30/22. Misty Stanley aware and checking for time. Pt verbalized understanding that we will call her back with time and date for surgery when confirmed

## 2022-04-03 ENCOUNTER — Telehealth: Payer: Self-pay | Admitting: *Deleted

## 2022-04-03 NOTE — Telephone Encounter (Signed)
Called to notify pt of surgery date. LVM to call office

## 2022-04-04 ENCOUNTER — Telehealth: Payer: Self-pay | Admitting: *Deleted

## 2022-04-04 NOTE — Telephone Encounter (Signed)
Pt called office at end of day Friday 04/03/22. Spoke to pt and gave her surgery date 06/29/22, pre-op 06/16/22, and post-op 07/06/22 and discussed needing to collect $500 deposit this week. Pt states she would like to pay by phone instead of coming in to office and she needs to change date of pre-op appointment. Advised we will call her Monday to handle this. Pt verbalized understanding.

## 2022-04-07 ENCOUNTER — Encounter: Payer: Self-pay | Admitting: *Deleted

## 2022-04-14 ENCOUNTER — Encounter
Admission: RE | Admit: 2022-04-14 | Discharge: 2022-04-14 | Disposition: A | Discharge status: Home / Self Care | Payer: TRICARE For Life (TFL) | Source: Ambulatory Visit | Attending: Orthopedic Surgery | Admitting: Orthopedic Surgery

## 2022-04-14 ENCOUNTER — Other Ambulatory Visit: Payer: Self-pay

## 2022-04-14 ENCOUNTER — Other Ambulatory Visit: Payer: Self-pay | Admitting: Orthopedic Surgery

## 2022-04-14 DIAGNOSIS — I10 Essential (primary) hypertension: Secondary | ICD-10-CM

## 2022-04-14 DIAGNOSIS — Z01812 Encounter for preprocedural laboratory examination: Secondary | ICD-10-CM

## 2022-04-14 HISTORY — DX: Pneumonia, unspecified organism: J18.9

## 2022-04-14 NOTE — Patient Instructions (Addendum)
Your procedure is scheduled on: 04/21/22 - Tuesday Report to the Registration Desk on the 1st floor of the [facility]. To find out your arrival time, please call [phone] between 1PM - 3PM on: 04/20/22 - Monday If your arrival time is 6:00 am, do not arrive prior to that time as the [facility] entrance doors do not open until 6:00 am.  REMEMBER: Instructions that are not followed completely may result in serious medical risk, up to and including death; or upon the discretion of your surgeon and anesthesiologist your surgery may need to be rescheduled.  Do not eat food or drink any fluids after midnight the night before surgery.  No gum chewing, lozengers or hard candies.   TAKE ONLY THESE MEDICATIONS THE MORNING OF SURGERY WITH A SIP OF WATER:  - omeprazole (PRILOSEC) 40 MG capsule, (take one the night before and one on the morning of surgery - helps to prevent nausea after surgery.)   Stop taking metFORMIN (GLUMETZA) 1000 MG (MOD) 24 hr tablet beginning 04/19/22, may resume taking on the day after your surgery.  Stop taking empagliflozin (JARDIANCE) 25 MG beginning 04/18/22, may resume taking on the day after your procedure.  - Insulin Glargine (LANTUS SOLOSTAR) - inject 1/2 of your prescribed dose of Insulin on the night before your surgery.  - insulin aspart (NOVOLOG) - Inject NO insulin on the morning of surgery, may resume with meals.  Follow recommendations from Cardiologist, Pulmonologist or PCP regarding stopping Aspirin, Coumadin, Plavix, Eliquis, Pradaxa, or Pletal. Do not take your Aspirin on the day of surgery.  Beginning 04/14/22 stop taking One week prior to surgery: Meloxicam  Stop Anti-inflammatories (NSAIDS) such as Advil, Aleve, Ibuprofen, Motrin, Naproxen, Naprosyn and Aspirin based products such as Excedrin, Goodys Powder, BC Powder.  Stop ANY OVER THE COUNTER supplements until after surgery- omega-3 fish oil , VITAMIN D3  You may take Tylenol if needed  for pain up until the day of surgery.  No Alcohol for 24 hours before or after surgery.  No Smoking including e-cigarettes for 24 hours prior to surgery.  No chewable tobacco products for at least 6 hours prior to surgery.  No nicotine patches on the day of surgery.  Do not use any "recreational" drugs for at least a week prior to your surgery.  Please be advised that the combination of cocaine and anesthesia may have negative outcomes, up to and including death. If you test positive for cocaine, your surgery will be cancelled.  On the morning of surgery brush your teeth with toothpaste and water, you may rinse your mouth with mouthwash if you wish. Do not swallow any toothpaste or mouthwash.  Use CHG Soap or wipes as directed on instruction sheet.  Do not wear jewelry, make-up, hairpins, clips or nail polish.  Do not wear lotions, powders, or perfumes.   Do not shave body from the neck down 48 hours prior to surgery just in case you cut yourself which could leave a site for infection.  Also, freshly shaved skin may become irritated if using the CHG soap.  Contact lenses, hearing aids and dentures may not be worn into surgery.  Do not bring valuables to the hospital. [facility] is not responsible for any missing/lost belongings or valuables.   Notify your doctor if there is any change in your medical condition (cold, fever, infection).  Wear comfortable clothing (specific to your surgery type) to the hospital.  After surgery, you can help prevent lung complications by doing  breathing exercises.  Take deep breaths and cough every 1-2 hours. Your doctor may order a device called an Incentive Spirometer to help you take deep breaths. When coughing or sneezing, hold a pillow firmly against your incision with both hands. This is called "splinting." Doing this helps protect your incision. It also decreases belly discomfort.  If you are being admitted to the hospital overnight, leave  your suitcase in the car. After surgery it may be brought to your room.  If you are being discharged the day of surgery, you will not be allowed to drive home. You will need a responsible adult (18 years or older) to drive you home and stay with you that night.   If you are taking public transportation, you will need to have a responsible adult (18 years or older) with you. Please confirm with your physician that it is acceptable to use public transportation.   Please call the Pre-admissions Testing Dept. at [phone] if you have any questions about these instructions.  Surgery Visitation Policy:  Patients undergoing a surgery or procedure may have two family members or support persons with them as long as the person is not COVID-19 positive or experiencing its symptoms.   Inpatient Visitation:    Visiting hours are 7 a.m. to 8 p.m. Up to four visitors are allowed at one time in a patient room, including children. The visitors may rotate out with other people during the day. One designated support person (adult) may remain overnight.

## 2022-04-15 ENCOUNTER — Encounter: Payer: Self-pay | Admitting: Urgent Care

## 2022-04-15 ENCOUNTER — Encounter
Admission: RE | Admit: 2022-04-15 | Discharge: 2022-04-15 | Disposition: A | Discharge status: Home / Self Care | Payer: Medicare Other | Source: Ambulatory Visit | Attending: Orthopedic Surgery | Admitting: Orthopedic Surgery

## 2022-04-15 DIAGNOSIS — I10 Essential (primary) hypertension: Secondary | ICD-10-CM | POA: Diagnosis not present

## 2022-04-15 DIAGNOSIS — Z01818 Encounter for other preprocedural examination: Secondary | ICD-10-CM | POA: Insufficient documentation

## 2022-04-15 DIAGNOSIS — Z01812 Encounter for preprocedural laboratory examination: Secondary | ICD-10-CM

## 2022-04-15 LAB — BASIC METABOLIC PANEL
Anion gap: 9 (ref 5–15)
BUN: 19 mg/dL (ref 8–23)
CO2: 24 mmol/L (ref 22–32)
Calcium: 9.2 mg/dL (ref 8.9–10.3)
Chloride: 106 mmol/L (ref 98–111)
Creatinine, Ser: 0.93 mg/dL (ref 0.44–1.00)
GFR, Estimated: >60 mL/min (ref >60)
Glucose, Bld: 137 mg/dL — ABNORMAL HIGH (ref 70–99)
Potassium: 4.2 mmol/L (ref 3.5–5.1)
Sodium: 139 mmol/L (ref 135–145)

## 2022-04-15 LAB — CBC
HCT: 39.1 % (ref 36.0–46.0)
Hemoglobin: 12.3 g/dL (ref 12.0–15.0)
MCH: 28.7 pg (ref 26.0–34.0)
MCHC: 31.5 g/dL (ref 30.0–36.0)
MCV: 91.4 fL (ref 80.0–100.0)
Platelets: 293 10*3/uL (ref 150–400)
RBC: 4.28 MIL/uL (ref 3.87–5.11)
RDW: 13.1 % (ref 11.5–15.5)
WBC: 8.2 10*3/uL (ref 4.0–10.5)
nRBC: 0 % (ref 0.0–0.2)

## 2022-04-21 ENCOUNTER — Ambulatory Visit: Payer: Medicare Other | Admitting: Anesthesiology

## 2022-04-21 ENCOUNTER — Encounter: Payer: Self-pay | Admitting: Orthopedic Surgery

## 2022-04-21 ENCOUNTER — Ambulatory Visit
Admission: RE | Admit: 2022-04-21 | Discharge: 2022-04-21 | Disposition: A | Discharge status: Home / Self Care | Payer: Medicare Other | Attending: Orthopedic Surgery | Admitting: Orthopedic Surgery

## 2022-04-21 ENCOUNTER — Ambulatory Visit: Payer: Medicare Other

## 2022-04-21 ENCOUNTER — Encounter
Admission: RE | Disposition: A | Discharge status: Home / Self Care | Payer: Self-pay | Source: Home / Self Care | Attending: Orthopedic Surgery

## 2022-04-21 ENCOUNTER — Other Ambulatory Visit: Payer: Self-pay

## 2022-04-21 DIAGNOSIS — M75111 Incomplete rotator cuff tear or rupture of right shoulder, not specified as traumatic: Secondary | ICD-10-CM | POA: Diagnosis present

## 2022-04-21 DIAGNOSIS — E119 Type 2 diabetes mellitus without complications: Secondary | ICD-10-CM | POA: Diagnosis not present

## 2022-04-21 DIAGNOSIS — I10 Essential (primary) hypertension: Secondary | ICD-10-CM | POA: Diagnosis not present

## 2022-04-21 DIAGNOSIS — Z01812 Encounter for preprocedural laboratory examination: Secondary | ICD-10-CM

## 2022-04-21 DIAGNOSIS — K219 Gastro-esophageal reflux disease without esophagitis: Secondary | ICD-10-CM | POA: Diagnosis not present

## 2022-04-21 DIAGNOSIS — M19011 Primary osteoarthritis, right shoulder: Secondary | ICD-10-CM | POA: Diagnosis not present

## 2022-04-21 DIAGNOSIS — Z87891 Personal history of nicotine dependence: Secondary | ICD-10-CM | POA: Insufficient documentation

## 2022-04-21 DIAGNOSIS — M25811 Other specified joint disorders, right shoulder: Secondary | ICD-10-CM | POA: Insufficient documentation

## 2022-04-21 HISTORY — PX: SHOULDER ARTHROSCOPY WITH OPEN ROTATOR CUFF REPAIR AND DISTAL CLAVICLE ACROMINECTOMY: SHX5683

## 2022-04-21 LAB — GLUCOSE, CAPILLARY
Glucose-Capillary: 156 mg/dL — ABNORMAL HIGH (ref 70–99)
Glucose-Capillary: 163 mg/dL — ABNORMAL HIGH (ref 70–99)

## 2022-04-21 SURGERY — SHOULDER ARTHROSCOPY WITH OPEN ROTATOR CUFF REPAIR AND DISTAL CLAVICLE ACROMINECTOMY
Anesthesia: General | Laterality: Right

## 2022-04-21 MED ORDER — LACTATED RINGERS IV SOLN: INTRAVENOUS | Status: DC | PRN

## 2022-04-21 MED ORDER — PROPOFOL 10 MG/ML IV BOLUS
INTRAVENOUS | Status: DC | PRN
  Administered 2022-04-21: 100 mg via INTRAVENOUS

## 2022-04-21 MED ORDER — ORAL CARE MOUTH RINSE: 15.0000 mL | Freq: Once | OROMUCOSAL | Status: AC

## 2022-04-21 MED ORDER — SUGAMMADEX SODIUM 200 MG/2ML IV SOLN
INTRAVENOUS | Status: DC | PRN
  Administered 2022-04-21: 200 mg via INTRAVENOUS

## 2022-04-21 MED ORDER — BUPIVACAINE HCL 0.25 % IJ SOLN
INTRAMUSCULAR | Status: DC | PRN
  Administered 2022-04-21: 30 mL

## 2022-04-21 MED ORDER — EPINEPHRINE PF 1 MG/ML IJ SOLN
INTRAMUSCULAR | Status: AC
  Filled 2022-04-21: qty 4

## 2022-04-21 MED ORDER — ROPIVACAINE HCL 5 MG/ML IJ SOLN
INTRAMUSCULAR | Status: DC | PRN
  Administered 2022-04-21: 15 mL via PERINEURAL
  Administered 2022-04-21: 5 mL via PERINEURAL

## 2022-04-21 MED ORDER — PHENYLEPHRINE HCL (PRESSORS) 10 MG/ML IV SOLN
INTRAVENOUS | Status: DC | PRN
  Administered 2022-04-21 (×5): 160 ug via INTRAVENOUS

## 2022-04-21 MED ORDER — CHLORHEXIDINE GLUCONATE CLOTH 2 % EX PADS
6.0000 | MEDICATED_PAD | Freq: Once | CUTANEOUS | Status: AC
  Administered 2022-04-21: 6 via TOPICAL

## 2022-04-21 MED ORDER — OXYCODONE HCL 5 MG/5ML PO SOLN: 5.0000 mg | Freq: Once | ORAL | Status: AC | PRN

## 2022-04-21 MED ORDER — NEOMYCIN-POLYMYXIN B GU 40-200000 IR SOLN
Status: DC | PRN
  Administered 2022-04-21: 2 mL

## 2022-04-21 MED ORDER — 0.9 % SODIUM CHLORIDE (POUR BTL) OPTIME
TOPICAL | Status: DC | PRN
  Administered 2022-04-21: 500 mL

## 2022-04-21 MED ORDER — OXYCODONE HCL 5 MG PO TABS
ORAL_TABLET | ORAL | Status: AC
  Filled 2022-04-21: qty 1

## 2022-04-21 MED ORDER — CHLORHEXIDINE GLUCONATE CLOTH 2 % EX PADS
6.0000 | MEDICATED_PAD | Freq: Once | CUTANEOUS | Status: AC
  Administered 2022-04-20: 6 via TOPICAL

## 2022-04-21 MED ORDER — FENTANYL CITRATE (PF) 100 MCG/2ML IJ SOLN
25.0000 ug | INTRAMUSCULAR | Status: DC | PRN
  Administered 2022-04-21 (×3): 25 ug via INTRAVENOUS

## 2022-04-21 MED ORDER — ONDANSETRON HCL 4 MG/2ML IJ SOLN
INTRAMUSCULAR | Status: DC | PRN
  Administered 2022-04-21 (×2): 4 mg via INTRAVENOUS

## 2022-04-21 MED ORDER — ACETAMINOPHEN 500 MG PO TABS
1000.0000 mg | ORAL_TABLET | ORAL | Status: AC
  Administered 2022-04-21: 1000 mg via ORAL

## 2022-04-21 MED ORDER — BUPIVACAINE HCL (PF) 0.25 % IJ SOLN
INTRAMUSCULAR | Status: AC
  Filled 2022-04-21: qty 30

## 2022-04-21 MED ORDER — FENTANYL CITRATE (PF) 100 MCG/2ML IJ SOLN
INTRAMUSCULAR | Status: AC
  Administered 2022-04-21: 25 ug via INTRAVENOUS
  Filled 2022-04-21: qty 2

## 2022-04-21 MED ORDER — ONDANSETRON HCL 4 MG/2ML IJ SOLN
INTRAMUSCULAR | Status: AC
  Filled 2022-04-21: qty 2

## 2022-04-21 MED ORDER — CHLORHEXIDINE GLUCONATE 0.12 % MT SOLN
OROMUCOSAL | Status: AC
  Administered 2022-04-21: 15 mL via OROMUCOSAL
  Filled 2022-04-21: qty 15

## 2022-04-21 MED ORDER — EPINEPHRINE PF 1 MG/ML IJ SOLN
INTRAMUSCULAR | Status: DC | PRN
  Administered 2022-04-21: 4 mL

## 2022-04-21 MED ORDER — PROPOFOL 1000 MG/100ML IV EMUL
INTRAVENOUS | Status: AC
  Filled 2022-04-21: qty 100

## 2022-04-21 MED ORDER — OXYCODONE HCL 5 MG PO TABS: 5.0000 mg | ORAL_TABLET | ORAL | 0 refills | Status: DC | PRN

## 2022-04-21 MED ORDER — CHLORHEXIDINE GLUCONATE 0.12 % MT SOLN: 15.0000 mL | Freq: Once | OROMUCOSAL | Status: AC

## 2022-04-21 MED ORDER — ROPIVACAINE HCL 5 MG/ML IJ SOLN
INTRAMUSCULAR | Status: AC
  Filled 2022-04-21: qty 30

## 2022-04-21 MED ORDER — ONDANSETRON HCL 4 MG PO TABS: 4.0000 mg | ORAL_TABLET | Freq: Three times a day (TID) | ORAL | 0 refills | Status: DC | PRN

## 2022-04-21 MED ORDER — LIDOCAINE HCL (PF) 1 % IJ SOLN
INTRAMUSCULAR | Status: AC
  Filled 2022-04-21: qty 30

## 2022-04-21 MED ORDER — LIDOCAINE HCL (PF) 1 % IJ SOLN
INTRAMUSCULAR | Status: DC | PRN
  Administered 2022-04-21: 6 mL

## 2022-04-21 MED ORDER — SODIUM CHLORIDE 0.9 % IV SOLN: INTRAVENOUS | Status: DC

## 2022-04-21 MED ORDER — MIDAZOLAM HCL 2 MG/2ML IJ SOLN
INTRAMUSCULAR | Status: AC
  Filled 2022-04-21: qty 2

## 2022-04-21 MED ORDER — OXYCODONE HCL 5 MG PO TABS
5.0000 mg | ORAL_TABLET | Freq: Once | ORAL | Status: AC | PRN
  Administered 2022-04-21: 5 mg via ORAL

## 2022-04-21 MED ORDER — FENTANYL CITRATE (PF) 100 MCG/2ML IJ SOLN
INTRAMUSCULAR | Status: AC
  Filled 2022-04-21: qty 2

## 2022-04-21 MED ORDER — SODIUM CHLORIDE FLUSH 0.9 % IV SOLN
INTRAVENOUS | Status: DC
Start: 2022-04-21 — End: 2022-04-21
  Filled 2022-04-21: qty 30

## 2022-04-21 MED ORDER — OXYCODONE HCL 5 MG PO TABS
ORAL_TABLET | ORAL | Status: AC
  Administered 2022-04-21: 5 mg via ORAL
  Filled 2022-04-21: qty 1

## 2022-04-21 MED ORDER — PHENYLEPHRINE HCL-NACL 20-0.9 MG/250ML-% IV SOLN
INTRAVENOUS | Status: DC | PRN
  Administered 2022-04-21: 40 ug/min via INTRAVENOUS

## 2022-04-21 MED ORDER — CEFAZOLIN SODIUM-DEXTROSE 2-4 GM/100ML-% IV SOLN
2.0000 g | INTRAVENOUS | Status: AC
  Administered 2022-04-21: 2 g via INTRAVENOUS

## 2022-04-21 MED ORDER — ACETAMINOPHEN 500 MG PO TABS
ORAL_TABLET | ORAL | Status: AC
  Filled 2022-04-21: qty 2

## 2022-04-21 MED ORDER — NEOMYCIN-POLYMYXIN B GU 40-200000 IR SOLN
Status: AC
  Filled 2022-04-21: qty 20

## 2022-04-21 MED ORDER — CEFAZOLIN SODIUM-DEXTROSE 2-4 GM/100ML-% IV SOLN
INTRAVENOUS | Status: AC
  Filled 2022-04-21: qty 100

## 2022-04-21 MED ORDER — FENTANYL CITRATE (PF) 100 MCG/2ML IJ SOLN
INTRAMUSCULAR | Status: DC | PRN
  Administered 2022-04-21 (×2): 50 ug via INTRAVENOUS

## 2022-04-21 MED ORDER — RINGERS IRRIGATION IR SOLN
Status: DC | PRN
  Administered 2022-04-21 (×2): 6000 mL
  Administered 2022-04-21: 12000 mL

## 2022-04-21 MED ORDER — ROCURONIUM BROMIDE 100 MG/10ML IV SOLN
INTRAVENOUS | Status: DC | PRN
  Administered 2022-04-21: 30 mg via INTRAVENOUS
  Administered 2022-04-21: 50 mg via INTRAVENOUS
  Administered 2022-04-21: 20 mg via INTRAVENOUS

## 2022-04-21 SURGICAL SUPPLY — 71 items
ADAPTER IRRIG TUBE 2 SPIKE SOL (ADAPTER) ×5 IMPLANT
ADPR TBG 2 SPK PMP STRL ASCP (ADAPTER) ×1
ANCH SUT 5.5 KNTLS PEEK (Orthopedic Implant) ×1 IMPLANT
ANCH SUT Q-FX 2.8 (Anchor) ×1 IMPLANT
ANCHOR ALL-SUT Q-FIX 2.8 (Anchor) ×9 IMPLANT
ANCHOR SUT 5.5 MULTIFIX (Orthopedic Implant) ×1 IMPLANT
CANNULA 5.75X7 CRYSTAL CLEAR (CANNULA) ×6 IMPLANT
CANNULA PARTIAL THREAD 2X7 (CANNULA) ×2 IMPLANT
CANNULA TWIST IN 8.25X9CM (CANNULA) ×4 IMPLANT
CONNECTOR PERFECT PASSER (CONNECTOR) ×5 IMPLANT
COOLER POLAR GLACIER W/PUMP (MISCELLANEOUS) ×3 IMPLANT
DEVICE SUCT BLK HOLE OR FLOOR (MISCELLANEOUS) ×5 IMPLANT
DRAPE 3/4 80X56 (DRAPES) ×3 IMPLANT
DRAPE INCISE IOBAN 66X45 STRL (DRAPES) ×3 IMPLANT
DRAPE U-SHAPE 47X51 STRL (DRAPES) ×3 IMPLANT
DURAPREP 26ML APPLICATOR (WOUND CARE) ×10 IMPLANT
ELECT REM PT RETURN 9FT ADLT (ELECTROSURGICAL) ×2
ELECTRODE REM PT RTRN 9FT ADLT (ELECTROSURGICAL) ×2 IMPLANT
GAUZE SPONGE 4X4 12PLY STRL (GAUZE/BANDAGES/DRESSINGS) ×3 IMPLANT
GAUZE XEROFORM 1X8 LF (GAUZE/BANDAGES/DRESSINGS) ×3 IMPLANT
GLOVE BIOGEL PI IND STRL 9 (GLOVE) ×2 IMPLANT
GLOVE BIOGEL PI INDICATOR 9 (GLOVE) ×1
GLOVE SURG ORTHO 9.0 STRL STRW (GLOVE) ×8 IMPLANT
GOWN STRL REUS TWL 2XL XL LVL4 (GOWN DISPOSABLE) ×3 IMPLANT
GOWN STRL REUS W/ TWL LRG LVL3 (GOWN DISPOSABLE) ×2 IMPLANT
GOWN STRL REUS W/TWL LRG LVL3 (GOWN DISPOSABLE) ×2
IV LACTATED RINGER IRRG 3000ML (IV SOLUTION) ×16
IV LR IRRIG 3000ML ARTHROMATIC (IV SOLUTION) ×16 IMPLANT
KIT STABILIZATION SHOULDER (MISCELLANEOUS) ×3 IMPLANT
KIT SUTURE 2.8 Q-FIX DISP (MISCELLANEOUS) ×2 IMPLANT
KIT SUTURETAK 3.0 INSERT PERC (KITS) IMPLANT
KIT TURNOVER KIT A (KITS) ×3 IMPLANT
MANIFOLD NEPTUNE II (INSTRUMENTS) ×6 IMPLANT
MASK FACE SPIDER DISP (MASK) ×3 IMPLANT
MAT ABSORB  FLUID 56X50 GRAY (MISCELLANEOUS) ×4
MAT ABSORB FLUID 56X50 GRAY (MISCELLANEOUS) ×6 IMPLANT
NDL SAFETY ECLIPSE 18X1.5 (NEEDLE) ×2 IMPLANT
NEEDLE HYPO 18GX1.5 SHARP (NEEDLE) ×2
NEEDLE HYPO 22GX1.5 SAFETY (NEEDLE) ×3 IMPLANT
NS IRRIG 500ML POUR BTL (IV SOLUTION) ×3 IMPLANT
PACK ARTHROSCOPY SHOULDER (MISCELLANEOUS) ×3 IMPLANT
PAD ABD DERMACEA PRESS 5X9 (GAUZE/BANDAGES/DRESSINGS) ×3 IMPLANT
PAD ARMBOARD 7.5X6 YLW CONV (MISCELLANEOUS) ×6 IMPLANT
PAD WRAPON POLAR SHDR XLG (MISCELLANEOUS) ×2 IMPLANT
PASSER SUT FIRSTPASS SELF (INSTRUMENTS) ×3 IMPLANT
SHAVER BLADE BONE CUTTER 4.5 (BLADE) ×3 IMPLANT
SHAVER BLADE TAPERED BLUNT 4 (BLADE) ×3 IMPLANT
SLING ULTRA II M (MISCELLANEOUS) ×1 IMPLANT
SPONGE T-LAP 18X18 ~~LOC~~+RFID (SPONGE) ×3 IMPLANT
STRIP CLOSURE SKIN 1/2X4 (GAUZE/BANDAGES/DRESSINGS) ×3 IMPLANT
SUT ETHILON 4-0 (SUTURE) ×2
SUT ETHILON 4-0 FS2 18XMFL BLK (SUTURE) ×1
SUT LASSO 90 DEG SD STR (SUTURE) IMPLANT
SUT MNCRL 4-0 (SUTURE) ×2
SUT MNCRL 4-0 27XMFL (SUTURE) ×1
SUT PDS AB 0 CT1 27 (SUTURE) ×6 IMPLANT
SUT PERFECTPASSER WHITE CART (SUTURE) ×9 IMPLANT
SUT SMART STITCH CARTRIDGE (SUTURE) ×10 IMPLANT
SUT ULTRABRAID 2 COBRAID 38 (SUTURE) IMPLANT
SUT VIC AB 0 CT1 36 (SUTURE) ×7 IMPLANT
SUT VIC AB 2-0 CT2 27 (SUTURE) ×3 IMPLANT
SUTURE ETHLN 4-0 FS2 18XMF BLK (SUTURE) ×2 IMPLANT
SUTURE MNCRL 4-0 27XMF (SUTURE) ×2 IMPLANT
SYR 10ML LL (SYRINGE) ×3 IMPLANT
TAPE MICROFOAM 4IN (TAPE) ×3 IMPLANT
TUBING CONNECTING 10 (TUBING) ×2 IMPLANT
TUBING INFLOW SET DBFLO PUMP (TUBING) ×3 IMPLANT
TUBING OUTFLOW SET DBLFO PUMP (TUBING) ×3 IMPLANT
WAND WEREWOLF FLOW 90D (MISCELLANEOUS) ×3 IMPLANT
WATER STERILE IRR 500ML POUR (IV SOLUTION) ×2 IMPLANT
WRAPON POLAR PAD SHDR XLG (MISCELLANEOUS) ×2

## 2022-04-21 NOTE — H&P (Signed)
PREOPERATIVE H&P  Chief Complaint: partial rotator cuff tear  HPI: Megan Mooney is a 66 y.o. female who presents for preoperative history and physical with a diagnosis of partial rotator cuff tear confirmed by MRI. Symptoms of persistent right shoulder pain are significantly impairing activities of daily living.  Patient has failed nonoperative management wished to proceed with arthroscopic evaluation of the right shoulder with possible rotator cuff repair.  Past Medical History:  Diagnosis Date   Allergy    seasonal, uses inhaler during this time   Anemia    Arthritis    neck, shoulders.  no limitations   Asthma    controlled   Depression    Diabetes mellitus without complication ([place])    Gastropathy    GERD (gastroesophageal reflux disease)    Hyperlipidemia    Hypertension    Pneumonia    Past Surgical History:  Procedure Laterality Date   ACHILLES TENDON SURGERY Right 08/19/2016   Procedure: ACHILLES TENDON REPAIR;  Surgeon: Samara Deist, DPM;  Location: [place];  Service: Podiatry;  Laterality: Right;   CALCANEAL OSTEOTOMY Right 08/19/2016   Procedure: HAGLUNDS/RETROCALCANEAL OSTECTOMY;  Surgeon: Samara Deist, DPM;  Location: [place];  Service: Podiatry;  Laterality: Right;  Diabetic - insulin and oral meds   COLONOSCOPY WITH PROPOFOL [name] 10/22/2017   Procedure: COLONOSCOPY WITH PROPOFOL;  Surgeon: Lollie Sails, MD;  Location: [facility];  Service: Endoscopy;  Laterality: [name];   DILATION AND CURETTAGE OF UTERUS     EYE SURGERY     cataract both eyes   IMPLANTATION VAGAL NERVE STIMULATOR  2021   leads right thigh   LAPAROSCOPIC OVARIAN CYSTECTOMY     NASAL SINUS SURGERY     PLANTAR FASCIA RELEASE Right 08/19/2016   Procedure: ENDOSCOPIC PLANTAR FASCIOTOMY;  Surgeon: Samara Deist, DPM;  Location: [place];  Service: Podiatry;  Laterality: Right;  POPLITEAL   TONSILLECTOMY     TUBAL LIGATION     Social History    Socioeconomic History   Marital status: Married    Spouse name: Patrick Jupiter   Number of children: Not on file   Years of education: Not on file   Highest education level: Not on file  Occupational History   Not on file  Tobacco Use   Smoking status: Former    Packs/day: 0.25    Years: 10.00    Total pack years: 2.50    Types: Cigarettes    Quit date: 11/04/1986    Years since quitting: 35.4   Smokeless tobacco: Never  Vaping Use   Vaping Use: Never used  Substance and Sexual Activity   Alcohol use: No    Alcohol/week: 0.0 standard drinks of alcohol    Comment: 1 glass wine/month   Drug use: No   Sexual activity: Yes    Birth control/protection: Post-menopausal    Comment: menopause  Other Topics Concern   Not on file  Social History Narrative   Not on file   Social Determinants of Health   Financial Resource Strain: Not on file  Food Insecurity: Not on file  Transportation Needs: Not on file  Physical Activity: Not on file  Stress: Not on file  Social Connections: Not on file   Family History  Problem Relation Age of Onset   Hypertension Mother    Hyperlipidemia Mother    Hypertension Father    Hyperlipidemia Father    Hyperlipidemia Brother    Hypertension Brother    Colon cancer Neg Hx  Esophageal cancer Neg Hx    Stomach cancer Neg Hx    Breast cancer Neg Hx    Allergies  Allergen Reactions   Ibuprofen Shortness Of Breath    Shortness of breath. Pt reports has taken recently without issues. Shortness of breath. 10/30/2016 Pt states that she has been taking ibuprofen with no adverse reaction. Shortness of breath. Pt reports has taken recently without issues.   Nsaids Shortness Of Breath   Tolmetin Shortness Of Breath   Prior to Admission medications   Medication Sig Start Date End Date Taking? Authorizing Provider  ACCU-CHEK FASTCLIX LANCETS MISC  08/02/14  Yes [provider]  carboxymethylcellulose (REFRESH PLUS) 0.5 % SOLN Place 1 drop  into both eyes 2 (two) times daily as needed.   Yes [provider]  cetirizine (ZYRTEC) 10 MG tablet Take 1 tablet (10 mg total) by mouth daily. 03/16/17  Yes Karamalegos, Netta Neat, DO  cholecalciferol (VITAMIN D3) 25 MCG (1000 UNIT) tablet Take 1,000 Units by mouth daily.   Yes [provider]  diclofenac sodium (VOLTAREN) 1 % GEL Apply topically 4 (four) times daily.   Yes [provider]  ferrous sulfate 324 MG TBEC Take 324 mg by mouth once a week. 2 x weekly   Yes [provider]  FREESTYLE LITE test strip  01/27/16  Yes [provider]  insulin aspart (NOVOLOG) 100 UNIT/ML FlexPen Inject 3 Units into the skin daily.   Yes [provider]  Insulin Glargine (LANTUS SOLOSTAR) 100 UNIT/ML Solostar Pen 14 Units at bedtime. 12/20/18  Yes [provider]  losartan (COZAAR) 100 MG tablet Take 100 mg by mouth daily.   Yes [provider]  meloxicam (MOBIC) 7.5 MG tablet Take 7.5 mg by mouth daily.   Yes [provider]  omega-3 fish oil (MAXEPA) 1000 MG CAPS capsule Take 1 capsule by mouth daily.   Yes [provider]  omeprazole (PRILOSEC) 20 MG capsule TAKE 1 CAPSULE DAILY Patient taking differently: 40 mg daily. 05/13/16  Yes Krebs, Amy Lauren, NP  simvastatin (ZOCOR) 20 MG tablet Take 20 mg by mouth daily.   Yes [provider]  valACYclovir (VALTREX) 500 MG tablet Take by mouth daily.   Yes [provider]  acyclovir ointment (ZOVIRAX) 5 % as needed. 08/19/16   [provider]  empagliflozin (JARDIANCE) 25 MG TABS tablet Take 25 mg by mouth daily.    [provider]  Glucagon (BAQSIMI ONE PACK) 3 MG/DOSE POWD 3 mg by Left Nare route once as needed for up to 1 dose. In no response after , use a new intranasal device. 01/13/19   [provider]  metFORMIN (GLUMETZA) 1000 MG (MOD) 24 hr tablet 2 (two) times daily with a meal.    [provider]      Positive ROS: All other systems have been reviewed and were otherwise negative with the exception of those mentioned in the HPI and as above.  Physical Exam: General: Alert, no acute distress Cardiovascular: Regular rate and rhythm, no murmurs rubs or gallops.  No pedal edema Respiratory: Clear to auscultation bilaterally, no wheezes rales or rhonchi. No cyanosis, no use of accessory musculature GI: No organomegaly, abdomen is soft and non-tender nondistended with positive bowel sounds. Skin: Skin intact, no lesions within the operative field. Neurologic: Sensation intact distally Psychiatric: Patient is competent for consent with normal mood and affect Lymphatic: No cervical lymphadenopathy  MUSCULOSKELETAL: The patient can forward elevate and abduct to approximately  150 degrees with pain in the mid-range of abduction and full forward elevation. She had positive impingement signs, but no apprehension or instability. She has pain with a downward directed force on her abducted shoulder, but does not demonstrate significant rotator cuff weakness. She had pain with impingement testing but no apprehension or instability. She has full digital wrist and elbow range of motion, intact sensation to light touch and palpable radial pulse.  Assessment: partial rotator cuff tear  Plan: Plan for Procedure(s): Right shoulder arthroscopic subacromial decompression, distal clavicle excision and possible mini open rotator cuff repair and biceps tenodesis versus tenotomy.  I reviewed the details of the operation as well as the postoperative course.  A preop history and physical was performed at the bedside this morning.  Reviewed the details of the operation as well as the postoperative course with the patient and her husband who is at the bedside this morning.  I marked the right shoulder according to hospital's correct site of surgery protocol after verbally confirming with the patient that this was the  correct site of surgery.  I discussed the risks and benefits of surgery. The risks include but are not limited to infection, bleeding, nerve or blood vessel injury, joint stiffness or loss of motion, persistent pain, weakness or instability, retear of the rotator cuff, failure of the repair and the need for further surgery.  Patient understood these risks and wished to proceed.      Juanell Fairly, MD   04/21/2022 7:51 AM

## 2022-04-21 NOTE — Discharge Instructions (Addendum)
AMBULATORY SURGERY  DISCHARGE INSTRUCTIONS   The drugs that you were given will stay in your system until tomorrow so for the next 24 hours you should not:  Drive an automobile Make any legal decisions Drink any alcoholic beverage   You may resume regular meals tomorrow.  Today it is better to start with liquids and gradually work up to solid foods.  You may eat anything you prefer, but it is better to start with liquids, then soup and crackers, and gradually work up to solid foods.   Please notify your doctor immediately if you have any unusual bleeding, trouble breathing, redness and pain at the surgery site, drainage, fever, or pain not relieved by medication.     Additional Instructions:   POLAR CARE INFORMATION  MassAdvertisement.it  How to use Breg Polar Care [facility] Therapy System?  YouTube   ShippingScam.co.uk  OPERATING INSTRUCTIONS  Start the product With dry hands, connect the transformer to the electrical connection located on the top of the cooler. Next, plug the transformer into an appropriate electrical outlet. The unit will automatically start running at this point.  To stop the pump, disconnect electrical power.  Unplug to stop the product when not in use. Unplugging the Polar Care unit turns it off. Always unplug immediately after use. Never leave it plugged in while unattended. Remove pad.    FIRST ADD WATER TO FILL LINE, THEN ICE---Replace ice when existing ice is almost melted  1 Discuss Treatment with your Licensed Health Care Practitioner and Use Only as Prescribed 2 Apply Insulation Barrier & Cold Therapy Pad 3 Check for Moisture 4 Inspect Skin Regularly  Tips and Trouble Shooting Usage Tips 1. Use cubed or chunked ice for optimal performance. 2. It is recommended to drain the Pad between uses. To drain the pad, hold the Pad upright with the hose pointed toward the ground. Depress the black plunger and allow water to drain  out. 3. You may disconnect the Pad from the unit without removing the pad from the affected area by depressing the silver tabs on the hose coupling and gently pulling the hoses apart. The Pad and unit will seal itself and will not leak. Note: Some dripping during release is normal. 4. DO NOT RUN PUMP WITHOUT WATER! The pump in this unit is designed to run with water. Running the unit without water will cause permanent damage to the pump. 5. Unplug unit before removing lid.  TROUBLESHOOTING GUIDE Pump not running, Water not flowing to the pad, Pad is not getting cold 1. Make sure the transformer is plugged into the wall outlet. 2. Confirm that the ice and water are filled to the indicated levels. 3. Make sure there are no kinks in the pad. 4. Gently pull on the blue tube to make sure the tube/pad junction is straight. 5. Remove the pad from the treatment site and ll it while the pad is lying at; then reapply. 6. Confirm that the pad couplings are securely attached to the unit. Listen for the double clicks (Figure 1) to confirm the pad couplings are securely attached.  Leaks    Note: Some condensation on the lines, controller, and pads is unavoidable, especially in warmer climates. 1. If using a Breg Polar Care Cold Therapy unit with a detachable Cold Therapy Pad, and a leak exists (other than condensation on the lines) disconnect the pad couplings. Make sure the silver tabs on the couplings are depressed before reconnecting the pad to the pump hose; then  confirm both sides of the coupling are properly clicked in. 2. If the coupling continues to leak or a leak is detected in the pad itself, stop using it and call [employer] Customer Care at [phone].  Cleaning After use, empty and dry the unit with a soft cloth. Warm water and mild detergent may be used occasionally to clean the pump and tubes.  WARNING: The Polar Care Cube can be cold enough to cause serious injury, including full skin necrosis.  Follow these Operating Instructions, and carefully read the Product Insert (see pouch on side of unit) and the Cold Therapy Pad Fitting Instructions (provided with each Cold Therapy Pad) prior to use.  SHOULDER SLING IMMOBILIZER   VIDEO Slingshot 2 Shoulder Brace Application - YouTube ---https://www.porter.info/  INSTRUCTIONS While supporting the injured arm, slide the forearm into the sling. Wrap the adjustable shoulder strap around the neck and shoulders and attach the strap end to the sling using  the "alligator strap tab."  Adjust the shoulder strap to the required length. Position the shoulder pad behind the neck. To secure the shoulder pad location (optional), pull the shoulder strap away from the shoulder pad, unfold the hook material on the top of the pad, then press the shoulder strap back onto the hook material to secure the pad in place. Attach the closure strap across the open top of the sling. Position the strap so that it holds the arm securely in the sling. Next, attach the thumb strap to the open end of the sling between the thumb and fingers. After sling has been fit, it may be easily removed and reapplied using the quick release buckle on shoulder strap. If a neutral pillow or 15 abduction pillow is included, place the pillow at the waistline. Attach the sling to the pillow, lining up hook material on the pillow with the loop on sling. Adjust the waist strap to fit.  If waist strap is too long, cut it to fit. Use the small piece of double sided hook material (located on top of the pillow) to secure the strap end. Place the double sided hook material on the inside of the cut strap end and secure it to the waist strap.     If no pillow is included, attach the waist strap to the sling and adjust to fit.    Washing Instructions: Straps and sling must be removed and cleaned regularly depending on your activity level and perspiration. Hand wash straps and sling in  cold water with mild detergent, rinse, air dry              Interscalene Nerve Block with Exparel   For your surgery you have received an Interscalene Nerve Block with Exparel. Nerve Blocks affect many types of nerves, including nerves that control movement, pain and normal sensation.  You may experience feelings such as numbness, tingling, heaviness, weakness or the inability to move your arm or the feeling or sensation that your arm has "fallen asleep". A nerve block with Exparel can last up to 5 days.  Usually the weakness wears off first.  The tingling and heaviness usually wear off next.  Finally you may start to notice pain.  Keep in mind that this may occur in any order.  Once a nerve block starts to wear off it is usually completely gone within 60 minutes. ISNB may cause mild shortness of breath, a hoarse voice, blurry vision, unequal pupils, or drooping of the face on the same side as the  nerve block.  These symptoms will usually resolve with the numbness.  Very rarely the procedure itself can cause mild seizures. If needed, your surgeon will give you a prescription for pain medication.  It will take about 60 minutes for the oral pain medication to become fully effective.  So, it is recommended that you start taking this medication before the nerve block first begins to wear off, or when you first begin to feel discomfort. Take your pain medication only as prescribed.  Pain medication can cause sedation and decrease your breathing if you take more than you need for the level of pain that you have. Nausea is a common side effect of many pain medications.  You may want to eat something before taking your pain medicine to prevent nausea. After an Interscalene nerve block, you cannot feel pain, pressure or extremes in temperature in the effected arm.  Because your arm is numb it is at an increased risk for injury.  To decrease the possibility of injury, please practice the following:  While  you are awake change the position of your arm frequently to prevent too much pressure on any one area for prolonged periods of time.  If you have a cast or tight dressing, check the color or your fingers every couple of hours.  Call your surgeon with the appearance of any discoloration (white or blue). If you are given a sling to wear before you go home, please wear it  at all times until the block has completely worn off.  Do not get up at night without your sling. Please contact [facility] or your surgeon if you do not begin to regain sensation after 7 days from the surgery.  Anesthesia may be contacted by calling the Same Day Surgery Department, Mon. through Fri., 6 am to 4 pm at [phone].   If you experience any other problems or concerns, please contact your surgeon's office. If you experience severe or prolonged shortness of breath go to the nearest emergency department.

## 2022-04-21 NOTE — Transfer of Care (Addendum)
Immediate Anesthesia Transfer of Care Note  Patient: Megan Mooney  Procedure(s) Performed: SHOULDER ARTHROSCOPY WITH OPEN ROTATOR CUFF REPAIR AND DISTAL CLAVICLE ACROMINECTOMY (Right)  Patient Location: PACU  Anesthesia Type:General  Level of Consciousness: drowsy  Airway & Oxygen Therapy: Patient Spontanous Breathing and Patient connected to face mask oxygen  Post-op Assessment: Report given to RN and Post -op Vital signs reviewed and stable  Post vital signs: Reviewed and stable  Last Vitals:  Vitals Value Taken Time  BP    Temp    Pulse    Resp    SpO2      Last Pain:  Vitals:   04/21/22 0646  TempSrc: Temporal  PainSc: 7       Patients Stated Pain Goal: 0 (04/21/22 0646)  Complications: No notable events documented.

## 2022-04-21 NOTE — OR Nursing (Signed)
Pain is still 7. Patient requested a ISNB for pain control. Anesthesia notified.

## 2022-04-21 NOTE — Anesthesia Procedure Notes (Signed)
Procedure Name: Intubation Date/Time: 04/21/2022 8:04 AM  Performed by: Philbert Riser, CRNAPre-anesthesia Checklist: Patient identified, Patient being monitored, Timeout performed, Emergency Drugs available and Suction available Patient Re-evaluated:Patient Re-evaluated prior to induction Oxygen Delivery Method: Circle system utilized Preoxygenation: Pre-oxygenation with 100% oxygen Induction Type: IV induction Ventilation: Mask ventilation without difficulty Laryngoscope Size: 3 and McGraph Grade View: Grade I Tube type: Oral Tube size: 7.0 mm Number of attempts: 1 Airway Equipment and Method: Stylet Placement Confirmation: ETT inserted through vocal cords under direct vision, positive ETCO2 and breath sounds checked- equal and bilateral Secured at: 21 cm Tube secured with: Tape Dental Injury: Teeth and Oropharynx as per pre-operative assessment

## 2022-04-21 NOTE — Anesthesia Preprocedure Evaluation (Signed)
Anesthesia Evaluation  Patient identified by MRN, date of birth, ID band Patient awake    Reviewed: Allergy & Precautions, NPO status , Patient's Chart, lab work & pertinent test results  History of Anesthesia Complications Negative for: history of anesthetic complications  Airway Mallampati: III  TM Distance: >3 FB Neck ROM: full    Dental  (+) Chipped   Pulmonary neg shortness of breath, asthma , former smoker,    Pulmonary exam normal        Cardiovascular Exercise Tolerance: Good hypertension, (-) angina(-) Past MI and (-) DOE Normal cardiovascular exam     Neuro/Psych negative neurological ROS  negative psych ROS   GI/Hepatic Neg liver ROS, GERD  Controlled,  Endo/Other  diabetes, Type 2  Renal/GU      Musculoskeletal   Abdominal   Peds  Hematology negative hematology ROS (+)   Anesthesia Other Findings Past Medical History: No date: Allergy     Comment:  seasonal, uses inhaler during this time No date: Anemia No date: Arthritis     Comment:  neck, shoulders.  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 No date: GERD (gastroesophageal reflux disease) No date: Hyperlipidemia No date: Hypertension No date: Pneumonia  Past Surgical History: 08/19/2016: ACHILLES TENDON SURGERY; Right     Comment:  Procedure: ACHILLES TENDON REPAIR;  Surgeon: Gwyneth Revels, DPM;  Location: [facility];  Service:               Podiatry;  Laterality: Right; 08/19/2016: CALCANEAL OSTEOTOMY; Right     Comment:  Procedure: HAGLUNDS/RETROCALCANEAL OSTECTOMY;  Surgeon:               Gwyneth Revels, DPM;  Location: [facility];                Service: Podiatry;  Laterality: Right;  Diabetic -               insulin and oral meds 10/22/2017: COLONOSCOPY WITH PROPOFOL; [name]     Comment:   Procedure: COLONOSCOPY WITH PROPOFOL;  Surgeon:               Christena Deem, MD;  Location: [facility];                Service: Endoscopy;  Laterality: [name]; No date: DILATION AND CURETTAGE OF UTERUS No date: EYE SURGERY     Comment:  cataract both eyes 2021: IMPLANTATION VAGAL NERVE STIMULATOR     Comment:  leads right thigh No date: LAPAROSCOPIC OVARIAN CYSTECTOMY No date: NASAL SINUS SURGERY 08/19/2016: PLANTAR FASCIA RELEASE; Right     Comment:  Procedure: ENDOSCOPIC PLANTAR FASCIOTOMY;  Surgeon:               Gwyneth Revels, DPM;  Location: [facility];                Service: Podiatry;  Laterality: Right;  POPLITEAL No date: TONSILLECTOMY No date: TUBAL LIGATION  BMI    Body Mass Index: 24.27 kg/m      Reproductive/Obstetrics negative OB ROS                             Anesthesia Physical Anesthesia Plan  ASA: 3  Anesthesia Plan: General ETT  Post-op Pain Management:    Induction: Intravenous  PONV Risk Score and Plan: Ondansetron, Dexamethasone, Midazolam and Treatment may vary due to age or medical condition  Airway Management Planned: Oral ETT  Additional Equipment:   Intra-op Plan:   Post-operative Plan: Extubation in OR  Informed Consent: I have reviewed the patients History and Physical, chart, labs and discussed the procedure including the risks, benefits and alternatives for the proposed anesthesia with the patient or authorized representative who has indicated his/her understanding and acceptance.     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   Patient consented for risks of anesthesia including but not limited to:  - adverse reactions to medications - damage to eyes, teeth, lips or other oral mucosa - nerve damage due to positioning  - sore throat or hoarseness - Damage to  heart, brain, nerves, lungs, other parts of body or loss of life  Patient voiced understanding.)        Anesthesia Quick Evaluation

## 2022-04-21 NOTE — Anesthesia Procedure Notes (Addendum)
Anesthesia Regional Block: Interscalene brachial plexus block   Pre-Anesthetic Checklist: , timeout performed,  Correct Patient, Correct Site, Correct Laterality,  Correct Procedure, Correct Position, site marked,  Risks and benefits discussed,  Surgical consent,  Pre-op evaluation,  At surgeon's request and post-op pain management  Laterality: Upper and Right  Prep: chloraprep       Needles:  Injection technique: Single-shot  Needle Type: Stimiplex     Needle Length: 9cm  Needle Gauge: 22     Additional Needles:   Procedures:,,,, ultrasound used (permanent image in chart),,    Narrative:  Start time: 04/21/2022 2:03 PM End time: 04/21/2022 2:05 PM Injection made incrementally with aspirations every 5 mL.  Performed by: Personally  Anesthesiologist: Arita Severtson, Cleda Mccreedy, MD  Additional Notes: Patient now requesting PNB in Phase 2 for pain control.  Patient and husband had been consented for a possible post Op block if needed.  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  She and her husband were both re consented that she may be at increased risk for nerve injury based on her history.  They both voiced assent and would like to proceed.  Patient consented for risk and benefits of nerve block including but not limited to nerve damage, failed block, bleeding and infection.  Patient voiced understanding.  Functioning IV was confirmed and monitors were applied.  Timeout done prior to procedure and prior to any sedation being given to the patient.  Patient confirmed procedure site prior to any sedation given to the patient.  A 52mm 22ga Stimuplex needle was used. Sterile prep,hand hygiene and sterile gloves were used.  Minimal sedation used for procedure.  No paresthesia endorsed by patient during the procedure.  Negative aspiration and negative test dose prior to incremental administration of local anesthetic. The patient tolerated the procedure well with no immediate  complications.

## 2022-04-21 NOTE — Op Note (Signed)
04/21/2022  10:26 AM  PATIENT:  Megan Mooney  66 y.o. female  PRE-OPERATIVE DIAGNOSIS: Right shoulder partial rotator cuff tear, subacromial impingement and acromioclavicular joint arthrosis  POST-OPERATIVE DIAGNOSIS: Right shoulder partial rotator cuff tear, subacromial impingement and acromioclavicular joint arthrosis  PROCEDURE: Right shoulder arthroscopic subacromial decompression, distal clavicle excision and mini open rotator cuff repair  SURGEON:  Surgeon(s) and Role:    Thornton Park, MD - Primary  ANESTHESIA:   local and general   PREOPERATIVE INDICATIONS:  Megan Mooney is a  66 y.o. female with a diagnosis of partial rotator cuff tear, confirmed by MRI who failed nonoperative treatment and elected for surgical management.    The risks benefits and alternatives were discussed with the patient preoperatively including but not limited to the risks of infection, bleeding, nerve injury, persistent pain or weakness, shoulder stiffness/arthrofibrosis, failure of the repair, re-tear of the rotator cuff and the need for further surgery. Medical risks include DVT and pulmonary embolism, myocardial infarction, stroke, pneumonia, respiratory failure and death. Patient understood these risks and wished to proceed.  OPERATIVE IMPLANTS: [place] MultiFix anchor x 1 & [employer] Q Fix anchor x 1  OPERATIVE FINDINGS: High-grade partial-thickness tear of the supraspinatus with subacromial impingement and extensive bursitis and acromioclavicular joint arthrosis  OPERATIVE PROCEDURE: The patient was met in the preoperative area.  A preoperative history and physical was performed at the bedside. The right shoulder was signed with the word yes and my initials according the hospital's correct site of surgery protocol.   A pre-op history and physical was performed at the bedside.  Patient did not wish to have an interscalene block due to a previous postop complication with a lower extremity  block from a prior surgery.  Patient was brought to the operating room where she underwent general endotracheal intubation.  The patient was placed in a beachchair position.  A spider arm positioner was used for this case.  Examination under anesthesia revealed no loss of passive range of motion or instability with load shift testing. The patient had a negative sulcus sign.  Patient was prepped and draped in a sterile fashion. A timeout was performed to verify the patient's name, date of birth, medical record number, correct site of surgery and correct procedure to be performed there was also used to verify the patient received antibiotics that all appropriate instruments, implants and radiographs studies were available in the room. Once all in attendance were in agreement case began.  Patient received ancef 2 grams IV for pre-op antibiotics.  Bony landmarks were drawn out with a surgical marker along with proposed arthroscopy incisions.  These incisions were preinjected with 1% lidocaine plain.  An 11 blade was used to establish a posterior portal through which the arthroscope was placed in the glenohumeral joint. A full diagnostic examination of the shoulder was performed.  Findings on arthroscopy are noted above.  There is no evidence of the biceps tendon tear or labral tear.  Patient had fraying of the undersurface of the supraspinatus with a high-grade partial thickness tear from the bursal side with a full-thickness component.  The undersurface of the supraspinatus was debrided with a Dyonics tapered shaver blade.  A lateral portal was then created and the shaver blade was placed through the lateral portal into the full-thickness component of the supraspinatus tear.  The supraspinatus tear was debrided creating a full-thickness tear.  The arthroscope was then placed in the subacromial space.  Extensive bursitis was encountered and  debrided using a Dyonics tapered shaver blade and a 90 ArthroCare wand  from the lateral portal. A subacromial decompression was performed sing a 4.5 millimeter bone cutter shaver blade from the lateral portal.   A distal clavicle excision was then performed through the anterior portal using the same bone cutter shaver blade.  The Dyonics bone cutter shaver blade was then used to debride the greater tuberosity of all torn fibers of the rotator cuff until punctate bleeding was identified.   Two Perfect Pass sutures were placed in the lateral border of the rotator cuff tear. All arthroscopic instruments were then removed and the mini-open portion of the procedure began.  A saber-type incision was made along the lateral border of the acromion. The deltoid muscle was identified and split in line with its fibers which allowed visualization of the rotator cuff. The Perfect Pass sutures previously placed in the lateral border of the rotator cuff were brought out through the deltoid split.  A single [employer] anchor was placed at the articular margin of the humeral head with the greater tuberosity. The suture limbs of the Q Fix anchor were passed medially through the rotator cuff using a First Pass suture passer. The two Perfect Pass sutures were then anchored to the greater tuberosity footprint using a single [employer] Multifix anchor. The Perfect Pass sutures passed through the Multifix anchor were then tensioned to allow reduction of the rotator cuff to the greater tuberosity footprint. The medial row repair was then performed using the Q fix sutures, which were tied down using an arthroscopic knot tying technique.  Arthroscopic images of the repair were taken with the arthroscope both externally and from inside the glenohumeral joint.  All incisions were copiously irrigated. The deltoid fascia was repaired using a 0 Vicryl suture.  The subcutaneous tissue of all incisions were closed with a 2-0 Vicryl. Skin closure for the arthroscopic incisions was performed with 4-0  nylon. The skin edges of the saber incision was approximated with a running 4-0 undyed Monocryl.  Patient had 30 cc of 0.25% Marcaine with epi injected into the subacromial space to assist with postoperative pain management.  A dry sterile dressing was applied.  The patient was placed in an abduction sling and a Polar Care was applied to the right shoulder.  All sharp, sponge and it instrument counts were correct at the conclusion of the case. I was scrubbed and present for the entire case. I spoke with the patient's husband postoperatively to let him know the case had been performed without complication and the patient was stable in recovery room.

## 2022-04-21 NOTE — Anesthesia Postprocedure Evaluation (Signed)
Anesthesia Post Note  Patient: Megan Mooney  Procedure(s) Performed: SHOULDER ARTHROSCOPY WITH OPEN ROTATOR CUFF REPAIR AND DISTAL CLAVICLE ACROMINECTOMY (Right)  Patient location during evaluation: PACU Anesthesia Type: General Level of consciousness: awake and alert Pain management: pain level controlled Vital Signs Assessment: post-procedure vital signs reviewed and stable Respiratory status: spontaneous breathing, nonlabored ventilation, respiratory function stable and patient connected to nasal cannula oxygen Cardiovascular status: blood pressure returned to baseline and stable Postop Assessment: no apparent nausea or vomiting Anesthetic complications: no   No notable events documented.   Last Vitals:  Vitals:   04/21/22 1429 04/21/22 1436  BP: 126/86 124/79  Pulse: 85 85  Resp: 18 18  Temp:    SpO2: 100% 100%    Last Pain:  Vitals:   04/21/22 1429  TempSrc:   PainSc: 0-No pain                 Cleda Mccreedy [name]

## 2022-04-22 ENCOUNTER — Encounter: Payer: Self-pay | Admitting: Orthopedic Surgery

## 2022-04-23 NOTE — Progress Notes (Signed)
Pt's husband answered the phone call and stated that the mrs Donlon was still asleep.

## 2022-05-09 LAB — LAB REPORT - SCANNED: A1c: 7.2

## 2022-05-27 ENCOUNTER — Telehealth: Payer: Self-pay | Admitting: *Deleted

## 2022-05-27 NOTE — Telephone Encounter (Signed)
Called patient to see if she would be interested in moving up her surgery date to 06/10/22 but patient stated she cannot move her case and needs it to stay on 06/29/22.

## 2022-06-09 ENCOUNTER — Encounter: Payer: TRICARE For Life (TFL) | Admitting: Physician Assistant

## 2022-06-09 ENCOUNTER — Encounter: Payer: Self-pay | Admitting: Student

## 2022-06-16 ENCOUNTER — Encounter: Payer: TRICARE For Life (TFL) | Admitting: Physician Assistant

## 2022-06-19 ENCOUNTER — Encounter: Payer: TRICARE For Life (TFL) | Admitting: Surgical

## 2022-06-22 ENCOUNTER — Ambulatory Visit (INDEPENDENT_AMBULATORY_CARE_PROVIDER_SITE_OTHER): Payer: Self-pay | Admitting: Student

## 2022-06-22 ENCOUNTER — Telehealth: Payer: Self-pay | Admitting: Plastic Surgery

## 2022-06-22 VITALS — BP 144/81 | HR 99 | Wt 139.8 lb

## 2022-06-22 DIAGNOSIS — Z411 Encounter for cosmetic surgery: Secondary | ICD-10-CM

## 2022-06-22 MED ORDER — OXYCODONE HCL 5 MG PO TABS: 5.0000 mg | ORAL_TABLET | Freq: Three times a day (TID) | ORAL | 0 refills | Status: DC | PRN

## 2022-06-22 MED ORDER — ONDANSETRON HCL 4 MG PO TABS: 4.0000 mg | ORAL_TABLET | Freq: Three times a day (TID) | ORAL | 0 refills | Status: DC | PRN

## 2022-06-22 NOTE — Progress Notes (Addendum)
Patient ID: Megan Mooney, female    DOB: [birth year], 66 y.o.   MRN: [id number]  Chief Complaint  Patient presents with   Pre-op Exam      ICD-10-CM   1. Encounter for cosmetic procedure  Z41.1        History of Present Illness: Megan Mooney is a 66 y.o.  female  with a history of previous abdominoplasty.  She presents for preoperative evaluation for upcoming procedure, Abdominoplasty Revision, scheduled for 10/9/023 with Dr. Erin Hearing.  The patient has not had problems with anesthesia.  Patient denies any history of cardiac disease.  She states she does not take any blood thinners.  She states that she is a non-smoker.  Patient denies any hormone replacement or birth control use.  She reports history of 1 miscarriage.  She denies any personal or family history of clotting diseases.  Patient reports that her father had a blood clot in his leg.  She denies any personal history of blood clots.  Patient denies any recent surgeries, traumas, infections, strokes or heart attacks.  She denies any history of inflammatory bowel disease, lung disease or cancer.  She denies any recent changes in her health.  She states that she feels like her nose is a little stuffy at the moment.  She denies any fevers, chills or shortness of breath. She denies any headaches, chest pain or blurry vision.   Summary of Previous Visit: Patient was seen by Dr. Erin Hearing on 03/30/2022.  Patient reported she had an abdominoplasty by Dr. Towanda Malkin in 2020, and then liposuction in 2021.  Patient stated that she had an abdominoplasty scar that is painful, particular on the right side.  She also stated that she had some asymmetry.  Patient was interested in a revision.  Job: Retired  [place] Significant for: Hypertension, GERD, type 2 diabetes with insulin use, anemia, neurostimulator in right thigh  Patient reports that she had surgery on her right shoulder in August.    Patient reports her most recent hemoglobin A1c was 7.2 36-month  ago at the [place].  I am not able to find this in her chart.  The most recent A1c I see in the chart is 8.2 on 01/21/2022. Patient states she will send her most recent result via MyChart.   Patient also states she does not know what her most recent hemoglobin was.  Per chart review, her most recent hemoglobin was 12.3 on 04/15/2022.  Past Medical History: Allergies: Allergies  Allergen Reactions   Ibuprofen Shortness Of Breath    Shortness of breath. Pt reports has taken recently without issues. Shortness of breath. 10/30/2016 Pt states that she has been taking ibuprofen with no adverse reaction. Shortness of breath. Pt reports has taken recently without issues.   Nsaids Shortness Of Breath   Tolmetin Shortness Of Breath    Current Medications:  Current Outpatient Medications:    ACCU-CHEK FASTCLIX LANCETS MISC, , Disp: , Rfl:    acyclovir ointment (ZOVIRAX) 5 %, as needed., Disp: , Rfl:    carboxymethylcellulose (REFRESH PLUS) 0.5 % SOLN, Place 1 drop into both eyes 2 (two) times daily as needed., Disp: , Rfl:    cetirizine (ZYRTEC) 10 MG tablet, Take 1 tablet (10 mg total) by mouth daily., Disp: 90 tablet, Rfl: 1   cholecalciferol (VITAMIN D3) 25 MCG (1000 UNIT) tablet, Take 1,000 Units by mouth daily., Disp: , Rfl:    empagliflozin (JARDIANCE) 25 MG TABS tablet, Take 25 mg by mouth daily., Disp: ,  Rfl:    ferrous sulfate 324 MG TBEC, Take 324 mg by mouth once a week. 2 x weekly, Disp: , Rfl:    FREESTYLE LITE test strip, , Disp: , Rfl:    Glucagon (BAQSIMI ONE PACK) 3 MG/DOSE POWD, 3 mg by Left Nare route once as needed for up to 1 dose. In no response after , use a new intranasal device., Disp: , Rfl:    insulin aspart (NOVOLOG) 100 UNIT/ML FlexPen, Inject 3 Units into the skin daily., Disp: , Rfl:    Insulin Glargine (LANTUS SOLOSTAR) 100 UNIT/ML Solostar Pen, 14 Units at bedtime., Disp: , Rfl:    losartan (COZAAR) 100 MG tablet, Take 100 mg by mouth daily., Disp: , Rfl:     metFORMIN (GLUMETZA) 1000 MG (MOD) 24 hr tablet, 2 (two) times daily with a meal., Disp: , Rfl:    omega-3 fish oil (MAXEPA) 1000 MG CAPS capsule, Take 1 capsule by mouth daily., Disp: , Rfl:    oxyCODONE (OXY IR/ROXICODONE) 5 MG immediate release tablet, Take 1-2 tablets (5-10 mg total) by mouth every 4 (four) hours as needed for severe pain., Disp: 30 tablet, Rfl: 0   simvastatin (ZOCOR) 20 MG tablet, Take 20 mg by mouth daily., Disp: , Rfl:    valACYclovir (VALTREX) 500 MG tablet, Take by mouth daily., Disp: , Rfl:   Past Medical Problems: Past Medical History:  Diagnosis Date   Allergy    seasonal, uses inhaler during this time   Anemia    Arthritis    neck, shoulders.  no limitations   Asthma    controlled   Depression    Diabetes mellitus without complication (HCC)    Gastropathy    GERD (gastroesophageal reflux disease)    Hyperlipidemia    Hypertension    Pneumonia     Past Surgical History: Past Surgical History:  Procedure Laterality Date   ACHILLES TENDON SURGERY Right 08/19/2016   Procedure: ACHILLES TENDON REPAIR;  Surgeon: Gwyneth Revels, DPM;  Location: [facility];  Service: Podiatry;  Laterality: Right;   CALCANEAL OSTEOTOMY Right 08/19/2016   Procedure: HAGLUNDS/RETROCALCANEAL OSTECTOMY;  Surgeon: Gwyneth Revels, DPM;  Location: [facility];  Service: Podiatry;  Laterality: Right;  Diabetic - insulin and oral meds   COLONOSCOPY WITH PROPOFOL [name] 10/22/2017   Procedure: COLONOSCOPY WITH PROPOFOL;  Surgeon: Christena Deem, MD;  Location: [facility];  Service: Endoscopy;  Laterality: [name];   DILATION AND CURETTAGE OF UTERUS     EYE SURGERY     cataract both eyes   IMPLANTATION VAGAL NERVE STIMULATOR  2021   leads right thigh   LAPAROSCOPIC OVARIAN CYSTECTOMY     NASAL SINUS SURGERY     PLANTAR FASCIA RELEASE Right 08/19/2016   Procedure: ENDOSCOPIC PLANTAR FASCIOTOMY;  Surgeon: Gwyneth Revels, DPM;  Location: [facility];  Service:  Podiatry;  Laterality: Right;  POPLITEAL   SHOULDER ARTHROSCOPY WITH OPEN ROTATOR CUFF REPAIR AND DISTAL CLAVICLE ACROMINECTOMY Right 04/21/2022   Procedure: SHOULDER ARTHROSCOPY WITH OPEN ROTATOR CUFF REPAIR AND DISTAL CLAVICLE ACROMINECTOMY;  Surgeon: Juanell Fairly, MD;  Location: [facility] ORS;  Service: Orthopedics;  Laterality: Right;   TONSILLECTOMY     TUBAL LIGATION      Social History: Social History   Socioeconomic History   Marital status: Married    Spouse name: Deniece Portela   Number of children: Not on file   Years of education: Not on file   Highest education level: Not on file  Occupational History  Not on file  Tobacco Use   Smoking status: Former    Packs/day: 0.25    Years: 10.00    Total pack years: 2.50    Types: Cigarettes    Quit date: 11/04/1986    Years since quitting: 35.6   Smokeless tobacco: Never  Vaping Use   Vaping Use: Never used  Substance and Sexual Activity   Alcohol use: No    Alcohol/week: 0.0 standard drinks of alcohol    Comment: 1 glass wine/month   Drug use: No   Sexual activity: Yes    Birth control/protection: Post-menopausal    Comment: menopause  Other Topics Concern   Not on file  Social History Narrative   Not on file   Social Determinants of Health   Financial Resource Strain: Not on file  Food Insecurity: Not on file  Transportation Needs: Not on file  Physical Activity: Not on file  Stress: Not on file  Social Connections: Not on file  Intimate Partner Violence: Not on file    Family History: Family History  Problem Relation Age of Onset   Hypertension Mother    Hyperlipidemia Mother    Hypertension Father    Hyperlipidemia Father    Hyperlipidemia Brother    Hypertension Brother    Colon cancer Neg Hx    Esophageal cancer Neg Hx    Stomach cancer Neg Hx    Breast cancer Neg Hx     Review of Systems: Denies fevers, chills, SOB, headaches, chest pain   Physical Exam: Vital Signs BP (!) 144/81 (BP Location:  Left Arm, Patient Position: Sitting, Cuff Size: Small)   Pulse 99   Wt 139 lb 12.8 oz (63.4 kg)   SpO2 95%   BMI 24.76 kg/m   Physical Exam  Constitutional:      General: Not in acute distress.    Appearance: Normal appearance. Not ill-appearing.  HENT:     Head: Normocephalic and atraumatic.  Neck:     Musculoskeletal: Normal range of motion.  Cardiovascular:     Rate and Rhythm: Normal rate Pulmonary:     Effort: Pulmonary effort is normal. No respiratory distress.  Musculoskeletal: Normal range of motion.  Lower Extremities: No varicose veins noted, no swelling noted Skin:    General: Skin is warm and dry.     Findings: No erythema or rash.  Neurological:     Mental Status: Alert and oriented to person, place, and time. Mental status is at baseline.  Psychiatric:        Mood and Affect: Mood normal.        Behavior: Behavior normal.    Assessment/Plan: The patient is scheduled for abdominoplasty revision with Dr. Domenica Reamer.  Risks, benefits, and alternatives of procedure discussed, questions answered and consent obtained.    Smoking Status: Non-smoker; Counseling Given?  [name]  Caprini Score: 8; Risk Factors include: Age, Family History of Thrombosis, T2DM insulin dependent, and length of planned surgery. Recommendation for mechanical prophylaxis. Encourage early ambulation.  We will discuss possibly using Lovenox postoperatively with Dr. Domenica Reamer.  Pictures obtained: @consult   Post-op Rx sent to pharmacy: Oxycodone, Zofran  Patient states she still has about 10 tablets of pain pills from her orthopedic procedure.  PDMP was reviewed and it appears that patient was prescribed 15 days worth of Norco 5 mg-325 mg on 06/05/2022.  I discussed with the patient that she can take these in the postoperative period for her pain.  I discussed with the patient that I  will send her a few more oxycodone if she runs out of the Norco to make sure she is covered for about a week postoperatively.   Patient agreed with this plan.  Surgical Clearance will be sent to patient's PCP  I discussed with the patient to hold her apical fist in 3 to 4 days prior to surgery.  We discussed that she should hold her metformin and losartan the morning of surgery and that she should also hold her Sunday dose of Ozempic.  I discussed with the patient she should also take about half of her basal dose of insulin the night before surgery, and hold her insulin the morning of surgery.  Patient expressed understanding.  I discussed patient's A1c and hemoglobin with Dr. Domenica Reamer.  He is okay with proceeding with surgery at this time.  Patient was provided with the General Surgical Risk consent document and Pain Medication Agreement prior to their appointment.  They had adequate time to read through the risk consent documents and Pain Medication Agreement. We also discussed them in person together during this preop appointment. All of their questions were answered to their satisfaction.  Recommended calling if they have any further questions.  Risk consent form and Pain Medication Agreement to be scanned into patient's chart.  The risk that can be encountered for this procedure were discussed and include the following but not limited to these: asymmetry, fluid accumulation, firmness of the tissue, skin loss, decrease or no sensation, fat necrosis, bleeding, infection, healing delay.  Deep vein thrombosis, cardiac and pulmonary complications are risks to any procedure.  There are risks of anesthesia, changes to skin sensation and injury to nerves or blood vessels.  The muscle can be temporarily or permanently injured.  You may have an allergic reaction to tape, suture, glue, blood products which can result in skin discoloration, swelling, pain, skin lesions, poor healing.  Any of these can lead to the need for revisonal surgery or stage procedures.  Weight gain and weigh loss can also effect the long term appearance. The  results are not guaranteed to last a lifetime.  Future surgery may be required.     The consent was obtained with risks and complications reviewed which included bleeding, pain, scar, infection and the risk of anesthesia.  The patients questions were answered to the patients expressed satisfaction.    Electronically signed by: Laurena Spies, PA-C 06/22/2022 4:07 PM

## 2022-06-22 NOTE — Telephone Encounter (Signed)
LVM for pt to call back to reschedule post op appt due to provider leaving practice.

## 2022-06-23 ENCOUNTER — Encounter: Payer: Self-pay | Admitting: Student

## 2022-06-23 ENCOUNTER — Telehealth: Payer: Self-pay

## 2022-06-23 NOTE — Telephone Encounter (Signed)
Faxed sx clearance to PCP

## 2022-06-23 NOTE — Progress Notes (Unsigned)
Patient sent Hgb A1c results from the [place] via [place]. Result shows Hgb A1c of 7.2 on 05/09/22.

## 2022-06-29 DIAGNOSIS — Z411 Encounter for cosmetic surgery: Secondary | ICD-10-CM

## 2022-06-30 ENCOUNTER — Encounter: Payer: Self-pay | Admitting: *Deleted

## 2022-06-30 ENCOUNTER — Telehealth: Payer: Self-pay | Admitting: Plastic Surgery

## 2022-06-30 NOTE — Telephone Encounter (Signed)
Discussed post-op instructions with pt per "instructions after abdominal surgery" printout. Questions answered, no problems voiced by pt. Advised pt that I would send printout to her mychart and asked her to contact office with any further questions or concerns. Pt verbalized understanding.

## 2022-06-30 NOTE — Telephone Encounter (Signed)
Pt called asking advice on what to do with her drain tube, when to shower, she cant find what to do after her surgery in her paperwork. Please call back. Thank you

## 2022-07-06 ENCOUNTER — Encounter: Payer: TRICARE For Life (TFL) | Admitting: Plastic Surgery

## 2022-07-13 ENCOUNTER — Encounter: Payer: Self-pay | Admitting: Plastic Surgery

## 2022-07-13 ENCOUNTER — Ambulatory Visit (INDEPENDENT_AMBULATORY_CARE_PROVIDER_SITE_OTHER): Payer: TRICARE For Life (TFL) | Admitting: Plastic Surgery

## 2022-07-13 DIAGNOSIS — Z411 Encounter for cosmetic surgery: Secondary | ICD-10-CM

## 2022-07-16 NOTE — Progress Notes (Signed)
Patient is status post abdominoplasty revision.  She agrees she has significantly improved contour.  Physical exam Incisions clean dry intact, good abdominal contour  Assessment and plan: Patient is doing well after revision of abdominoplasty.  We will continue to follow her.

## 2022-08-05 ENCOUNTER — Encounter: Payer: Self-pay | Admitting: Student

## 2022-08-05 ENCOUNTER — Ambulatory Visit (INDEPENDENT_AMBULATORY_CARE_PROVIDER_SITE_OTHER): Payer: Self-pay | Admitting: Student

## 2022-08-05 VITALS — BP 130/77 | HR 80

## 2022-08-05 DIAGNOSIS — Z411 Encounter for cosmetic surgery: Secondary | ICD-10-CM

## 2022-08-05 NOTE — Progress Notes (Signed)
Patient is a 66 year old female who underwent abdominoplasty on 06/29/2022 with Dr. Domenica Reamer.  She is 5 weeks postop.  She presents to the clinic for postoperative follow-up.  Patient was last seen in the clinic on 07/13/2022.  At this visit, she reported she had significantly improved contour.  Incisions were clean dry and intact.  Plan was to continue to follow her.  Today, patient reports she is doing well.  She states she is overall very happy with her results.  She states that there is a small suture sticking out centrally from her incision.  She denies any other issues or concerns.  She denies any fevers or chills.  Chaperone present on exam.  On exam, patient is sitting upright in no acute distress.  Incision has healed well.  There is some firmness underneath the incision that is consistent with scarring.  There is no erythema, swelling or drainage.  There is a small suture that is protruding from the incision medially.  This was trimmed and patient tolerated well.  Abdomen has good contour.  I discussed with the patient that she can apply scar creams to her incisions such as Mederma, Silagen or Skinuva.  I discussed with the patient that she should gently massage the scar to help with the firmness underneath the scar.  Patient expressed understanding.  I discussed with the patient to wear compression for another week, and that she can start gradually increasing her activities next week.  Patient expressed understanding.  Patient to follow-up as needed.  I instructed the patient to call if she has any questions or concerns.

## 2022-10-21 DIAGNOSIS — Z9842 Cataract extraction status, left eye: Secondary | ICD-10-CM | POA: Insufficient documentation

## 2022-10-21 DIAGNOSIS — Z961 Presence of intraocular lens: Secondary | ICD-10-CM | POA: Insufficient documentation

## 2022-10-21 DIAGNOSIS — F32A Depression, unspecified: Secondary | ICD-10-CM | POA: Insufficient documentation

## 2022-10-21 DIAGNOSIS — K3184 Gastroparesis: Secondary | ICD-10-CM | POA: Insufficient documentation

## 2022-10-21 DIAGNOSIS — L84 Corns and callosities: Secondary | ICD-10-CM | POA: Insufficient documentation

## 2023-02-08 ENCOUNTER — Ambulatory Visit (INDEPENDENT_AMBULATORY_CARE_PROVIDER_SITE_OTHER): Payer: Medicare Other | Admitting: Student

## 2023-02-08 DIAGNOSIS — Z719 Counseling, unspecified: Secondary | ICD-10-CM

## 2023-02-08 DIAGNOSIS — L905 Scar conditions and fibrosis of skin: Secondary | ICD-10-CM

## 2023-02-08 DIAGNOSIS — Z9889 Other specified postprocedural states: Secondary | ICD-10-CM

## 2023-02-08 NOTE — Progress Notes (Signed)
   Referring Provider Entzminger, Danton Clap, MD [address] [place],  [place] [address]   CC: Follow up after abdominoplasty      Jonier Gatewood is an 67 y.o. female.  HPI: Patient is a 67 year old female who underwent abdominoplasty with Dr. Domenica Reamer on 06/29/2022.  She is a little over 7 months postop.  Patient presents to the clinic today with concerns about her incision.  Patient states that for the past few months, the right aspect of her incision has been painful.  She states that the pain is a burning type feeling.  She reports she has been massaging it a little bit and applying a lidocaine cream which she feels has been helping.  She denies any drainage or redness from the area.  She denies any fevers or chills.  She denies any infectious symptoms.  Review of Systems General: Denies fevers or chills.  Physical Exam    08/05/2022    2:00 PM 06/22/2022    1:54 PM 04/21/2022    2:36 PM  Vitals with BMI  Weight  139 lbs 13 oz   Systolic 130 144 604  Diastolic 77 81 79  Pulse 80 99 85    General:  No acute distress,  Alert and oriented, Non-Toxic, Normal speech and affect Chaperone present on exam.  On exam, patient is sitting upright in no acute distress.  Abdomen is soft and nontender.  Incision is well-healed.  There is some mild tenderness to palpation to the right half of the abdominal incision.  There is no overlying skin changes.  There is no surrounding erythema or drainage on exam.  There is a little bit of firmness underneath the incision that is consistent with some scarring.  There are no signs of infection on exam  Assessment/Plan  S/P abdominoplasty   I discussed with the patient that her pain is most likely nerve related pain.  I discussed with her that this could be due to some scarring underneath the incision.  I recommended to the patient that she continue to firmly massage the right side of her incision and apply silicone based products such as scar creams and silicone  tape.  Patient expressed understanding.  I did discuss with the patient that if her pain continues, she may have to follow back up with her PCP to talk about starting or increasing her dose of nerve related medications such as her Lyrica.  I told her for now that we will try conservative management with massage and silicone intervention.  Patient expressed understanding.  I like the patient to follow back up in a few weeks to see if her symptoms are improving.  I instructed her to call in the meantime if she has any questions or concerns.  Pictures were obtained of the patient and placed in the chart with the patient's or guardian's permission.    Laurena Spies 02/08/2023, 4:19 PM

## 2023-03-01 ENCOUNTER — Encounter: Payer: Self-pay | Admitting: Student

## 2023-03-01 ENCOUNTER — Ambulatory Visit (INDEPENDENT_AMBULATORY_CARE_PROVIDER_SITE_OTHER): Payer: Medicare Other | Admitting: Student

## 2023-03-01 VITALS — BP 156/90 | HR 87 | Ht 63.0 in | Wt 137.6 lb

## 2023-03-01 DIAGNOSIS — L905 Scar conditions and fibrosis of skin: Secondary | ICD-10-CM

## 2023-03-01 DIAGNOSIS — Z9889 Other specified postprocedural states: Secondary | ICD-10-CM

## 2023-03-01 NOTE — Progress Notes (Signed)
Referring Provider Entzminger, Danton Clap, MD [address] [place],  [place] [address]   CC:  Chief Complaint  Patient presents with   Follow-up      Megan Mooney is an 67 y.o. female.  HPI: Patient is a 67 year old female who underwent abdominoplasty with Dr. Domenica Reamer on 06/29/2022.  She is a little over 8 months postop.  Patient presents to the clinic today for follow-up on her incision.  Patient was last seen in the clinic on 02/08/2023.  At this visit, patient reported that for the past few months, her incision had been painful.  She reported that the pain was a burning pain.  Patient reported she had been massaging it and applying lidocaine cream which she felt was helping.  She denies any drainage or redness from the area.  She denied any infectious symptoms.  On exam, there was some mild tenderness to palpation to the right half of the abdominal incision.  There was a little bit of firmness underneath the incision that was consistent with some scarring.  I recommended patient continue to firmly massage the right side of her incision and apply silicone based products to the incision.  Today, patient reports she is doing well.  She states that her burning pain to the right side of her incision is about the same.  She reports she has been massaging her incision at least twice daily.  She denies any new concerns or complaints in regards to her incision.  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  Patient states that she has an appointment with neurology next week and will also discuss her scar pain.  Review of Systems General: No changes in health since previous exam  Physical Exam    08/05/2022    2:00 PM 06/22/2022    1:54 PM 04/21/2022    2:36 PM  Vitals with BMI  Weight  139 lbs 13 oz   Systolic 130 144 147  Diastolic 77 81 79  Pulse 80 99 85     General:  No acute distress,  Alert and oriented, Non-Toxic, Normal speech and affect Chaperone present on exam.  On exam, patient is sitting upright in no acute distress.  Abdomen is soft.  There is no overlying erythema.  Incision overall has healed well.  There does appear to be some scarring underneath the incision on the right aspect of the incision in comparison to the left.  There is no drainage, surrounding erythema or signs of infection on exam.  There is very minimal tenderness to palpation to the right side of the incision.  Assessment/Plan  S/P abdominoplasty   I discussed with the patient that given she is still having pain even after massaging, I offered her a referral to physical therapy as this may help with some of her pain.  I did also recommend that she continue to follow-up with her PCP and neurologist in regards to the pain she is experiencing.  Patient stated that she would like to see the neurologist first and think about physical therapy and declined the referral to physical therapy at this time.  I recommended the patient continue her scar massage as she has been twice daily.  We will do a telephone visit in a few weeks to see if the patient would like to try physical therapy.  Patient was in agreement with  the plan.  I instructed the patient to call in the meantime if she has any questions or concerns about anything.  Laurena Spies 03/01/2023, 1:54 PM

## 2023-03-31 ENCOUNTER — Ambulatory Visit (INDEPENDENT_AMBULATORY_CARE_PROVIDER_SITE_OTHER): Payer: Medicare Other | Admitting: Student

## 2023-03-31 DIAGNOSIS — H2510 Age-related nuclear cataract, unspecified eye: Secondary | ICD-10-CM | POA: Insufficient documentation

## 2023-03-31 DIAGNOSIS — E11311 Type 2 diabetes mellitus with unspecified diabetic retinopathy with macular edema: Secondary | ICD-10-CM | POA: Insufficient documentation

## 2023-03-31 DIAGNOSIS — L299 Pruritus, unspecified: Secondary | ICD-10-CM | POA: Insufficient documentation

## 2023-03-31 DIAGNOSIS — L905 Scar conditions and fibrosis of skin: Secondary | ICD-10-CM

## 2023-03-31 DIAGNOSIS — L309 Dermatitis, unspecified: Secondary | ICD-10-CM | POA: Insufficient documentation

## 2023-03-31 DIAGNOSIS — Z9889 Other specified postprocedural states: Secondary | ICD-10-CM

## 2023-03-31 DIAGNOSIS — E559 Vitamin D deficiency, unspecified: Secondary | ICD-10-CM | POA: Insufficient documentation

## 2023-03-31 NOTE — Progress Notes (Signed)
Referring Provider Entzminger, Danton Clap, MD [address] [place],  [place] [address]   CC: Follow up incisional pain     Megan Mooney is an 67 y.o. female.  HPI: Patient is a 67 year old female who underwent abdominoplasty with Dr. Domenica Reamer on 06/29/2022.  She is a little over 9 months postop.  She presents today for further discussion on pain to her incision.  Patient was last seen in the clinic on 03/01/2023.  At this visit, patient reported she is doing well.  She reported that the burning pain to the right side of her incision was about the same.  She reported she had been massaging her incision at least twice daily.  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  On exam, incision was overall healed well.  There was some scarring underneath the incision on the right aspect of the abdominal incision in comparison to the left.  There is very minimal tenderness to palpation to the right side of the incision.  Referral to physical therapy was offered to the patient as this may help with some of her pain.  Patient declined physical therapy at that time.  Recommended continued follow-up with her PCP and neurologist.  Plan was to do a telephone visit in about a month to see if patient was interested in physical therapy at that time.   Today, patient reports she is doing well.  She states that she is still experiencing pain and itching to her right abdominal scar similar to before.  She states that she has been massaging 2 times per day.  Patient did state that she saw neurology for neuropathy to her right lower extremity.  She states that she is going to undergo an ultrasound-guided tibial nerve injection to see if this will help with her symptoms.  She states that she did not discuss the scar to her right lower abdomen with neurology as she was hopeful the tibial nerve injection was going to help with her symptoms  to her abdominal scar as she is having the same symptoms that she is to her right lower extremity.  Patient states that she is also still seeing her PCP in regards to her pain as well.  Review of Systems General: Denies any changes since previous visit  Physical Exam Speaking in full and clear sentences.  No distress and patient's speech.  Assessment/Plan  S/P abdominoplasty   Recommended to the patient that she continue to follow-up with her neurologist and discuss with her neurologist and PCP possible management for her pain that she is experiencing.  Offered her physical therapy at this time, but patient states that she would still like to wait and talk with her neurologist first and see if they have any recommendations.  Discussed with the patient that there is not much else to offer her in terms of management for her pain at this time other than recommended follow-up with her PCP and possible pain management as well as neurologist.  Discussed with her that steroid injection into the scar could be an option for her potentially, but I would like her to explore options with her PCP and neurologist first.  Also discussed with her that we would not inject with steroids until approximately 1 year after surgery.  Patient expressed understanding and was in agreement.  She states that she will follow-up with her neurologist.  I discussed with the patient  that if her neurologist or PCP have any recommendations for [place] to do at this time, she may let us know.  Patient expressed understanding.  Recommended patient continue massage.  Discussed with her that she may follow-up as needed.  Patient expressed understanding.  The patient gave consent to have this visit done by telemedicine / virtual visit, two identifiers were used to identify patient. This is also consent for access the chart and treat the patient via this visit. The patient is located at home.  I, the provider, am at the office.  We spent 8  minutes together for the visit.  Joined by telephone.   Laurena Spies 03/31/2023, 3:18 PM

## 2023-05-26 ENCOUNTER — Other Ambulatory Visit: Payer: Self-pay | Admitting: Internal Medicine

## 2023-05-26 DIAGNOSIS — R748 Abnormal levels of other serum enzymes: Secondary | ICD-10-CM

## 2023-06-01 ENCOUNTER — Ambulatory Visit
Admission: RE | Admit: 2023-06-01 | Discharge: 2023-06-01 | Disposition: A | Discharge status: Home / Self Care | Payer: Medicare Other | Source: Ambulatory Visit | Attending: Internal Medicine | Admitting: Internal Medicine

## 2023-06-01 DIAGNOSIS — R748 Abnormal levels of other serum enzymes: Secondary | ICD-10-CM | POA: Diagnosis present

## 2023-09-08 ENCOUNTER — Other Ambulatory Visit: Payer: Self-pay | Admitting: Internal Medicine

## 2023-09-08 DIAGNOSIS — M542 Cervicalgia: Secondary | ICD-10-CM

## 2023-09-09 ENCOUNTER — Ambulatory Visit
Admission: RE | Admit: 2023-09-09 | Discharge: 2023-09-09 | Disposition: A | Discharge status: Home / Self Care | Payer: Medicare Other | Source: Ambulatory Visit | Attending: Internal Medicine | Admitting: Internal Medicine

## 2023-09-09 ENCOUNTER — Other Ambulatory Visit: Payer: Self-pay | Admitting: Internal Medicine

## 2023-09-09 ENCOUNTER — Ambulatory Visit
Admission: RE | Admit: 2023-09-09 | Discharge: 2023-09-09 | Disposition: A | Discharge status: Home / Self Care | Payer: Medicare Other | Attending: Internal Medicine | Admitting: Internal Medicine

## 2023-09-09 DIAGNOSIS — M25511 Pain in right shoulder: Secondary | ICD-10-CM | POA: Insufficient documentation

## 2023-09-09 DIAGNOSIS — M25552 Pain in left hip: Secondary | ICD-10-CM | POA: Diagnosis present

## 2023-09-09 DIAGNOSIS — M542 Cervicalgia: Secondary | ICD-10-CM

## 2024-03-02 ENCOUNTER — Other Ambulatory Visit: Payer: Self-pay | Admitting: Internal Medicine

## 2024-03-02 DIAGNOSIS — Z780 Asymptomatic menopausal state: Secondary | ICD-10-CM
